# Patient Record
Sex: Male | Born: 1965 | Race: White | Hispanic: No | Marital: Married | Smoking: Former smoker
Health system: Southern US, Community
[De-identification: ages and names within clinical notes are randomized; demographics above are authoritative.]

## PROBLEM LIST (undated history)

## (undated) DIAGNOSIS — E785 Hyperlipidemia, unspecified: Secondary | ICD-10-CM

## (undated) DIAGNOSIS — G4730 Sleep apnea, unspecified: Secondary | ICD-10-CM

## (undated) DIAGNOSIS — R609 Edema, unspecified: Secondary | ICD-10-CM

## (undated) DIAGNOSIS — E079 Disorder of thyroid, unspecified: Secondary | ICD-10-CM

## (undated) DIAGNOSIS — E559 Vitamin D deficiency, unspecified: Secondary | ICD-10-CM

## (undated) DIAGNOSIS — M1990 Unspecified osteoarthritis, unspecified site: Secondary | ICD-10-CM

## (undated) DIAGNOSIS — T7840XA Allergy, unspecified, initial encounter: Secondary | ICD-10-CM

## (undated) DIAGNOSIS — M109 Gout, unspecified: Secondary | ICD-10-CM

## (undated) DIAGNOSIS — I10 Essential (primary) hypertension: Secondary | ICD-10-CM

## (undated) DIAGNOSIS — L659 Nonscarring hair loss, unspecified: Secondary | ICD-10-CM

## (undated) HISTORY — DX: Gout, unspecified: M10.9

## (undated) HISTORY — DX: Hyperlipidemia, unspecified: E78.5

## (undated) HISTORY — PX: SHOULDER ARTHROSCOPY: SHX128

## (undated) HISTORY — PX: KNEE SURGERY: SHX244

## (undated) HISTORY — PX: LEG SURGERY: SHX1003

## (undated) HISTORY — PX: OTHER SURGICAL HISTORY: SHX169

## (undated) HISTORY — DX: Allergy, unspecified, initial encounter: T78.40XA

## (undated) HISTORY — DX: Edema, unspecified: R60.9

## (undated) HISTORY — DX: Vitamin D deficiency, unspecified: E55.9

## (undated) HISTORY — DX: Nonscarring hair loss, unspecified: L65.9

## (undated) HISTORY — DX: Sleep apnea, unspecified: G47.30

---

## 2008-07-05 ENCOUNTER — Emergency Department: Payer: Self-pay | Admitting: Emergency Medicine

## 2009-08-13 ENCOUNTER — Ambulatory Visit: Payer: Self-pay | Admitting: Specialist

## 2009-08-21 ENCOUNTER — Ambulatory Visit: Payer: Self-pay | Admitting: Specialist

## 2009-08-21 HISTORY — PX: SHOULDER ARTHROSCOPY: SHX128

## 2012-05-11 ENCOUNTER — Ambulatory Visit: Payer: Self-pay | Admitting: Family Medicine

## 2013-04-06 ENCOUNTER — Ambulatory Visit: Payer: Self-pay | Admitting: Family Medicine

## 2013-04-28 ENCOUNTER — Ambulatory Visit: Payer: Self-pay | Admitting: Family Medicine

## 2013-08-29 ENCOUNTER — Ambulatory Visit: Payer: Self-pay | Admitting: Internal Medicine

## 2013-08-29 LAB — RAPID STREP-A WITH REFLX: Micro Text Report: NEGATIVE

## 2013-09-01 LAB — BETA STREP CULTURE(ARMC)

## 2015-01-06 ENCOUNTER — Ambulatory Visit: Payer: Self-pay | Admitting: Physician Assistant

## 2015-01-06 LAB — RAPID STREP-A WITH REFLX: Micro Text Report: NEGATIVE

## 2015-01-08 LAB — BETA STREP CULTURE(ARMC)

## 2016-12-22 IMAGING — CR DG CHEST 2V
2 series · 2 of 2 positions shown · non-contrast
Comparison: None.

CLINICAL DATA: Cough and wheezing for 1 week.

EXAM:
CHEST  2 VIEW

[chest pa]
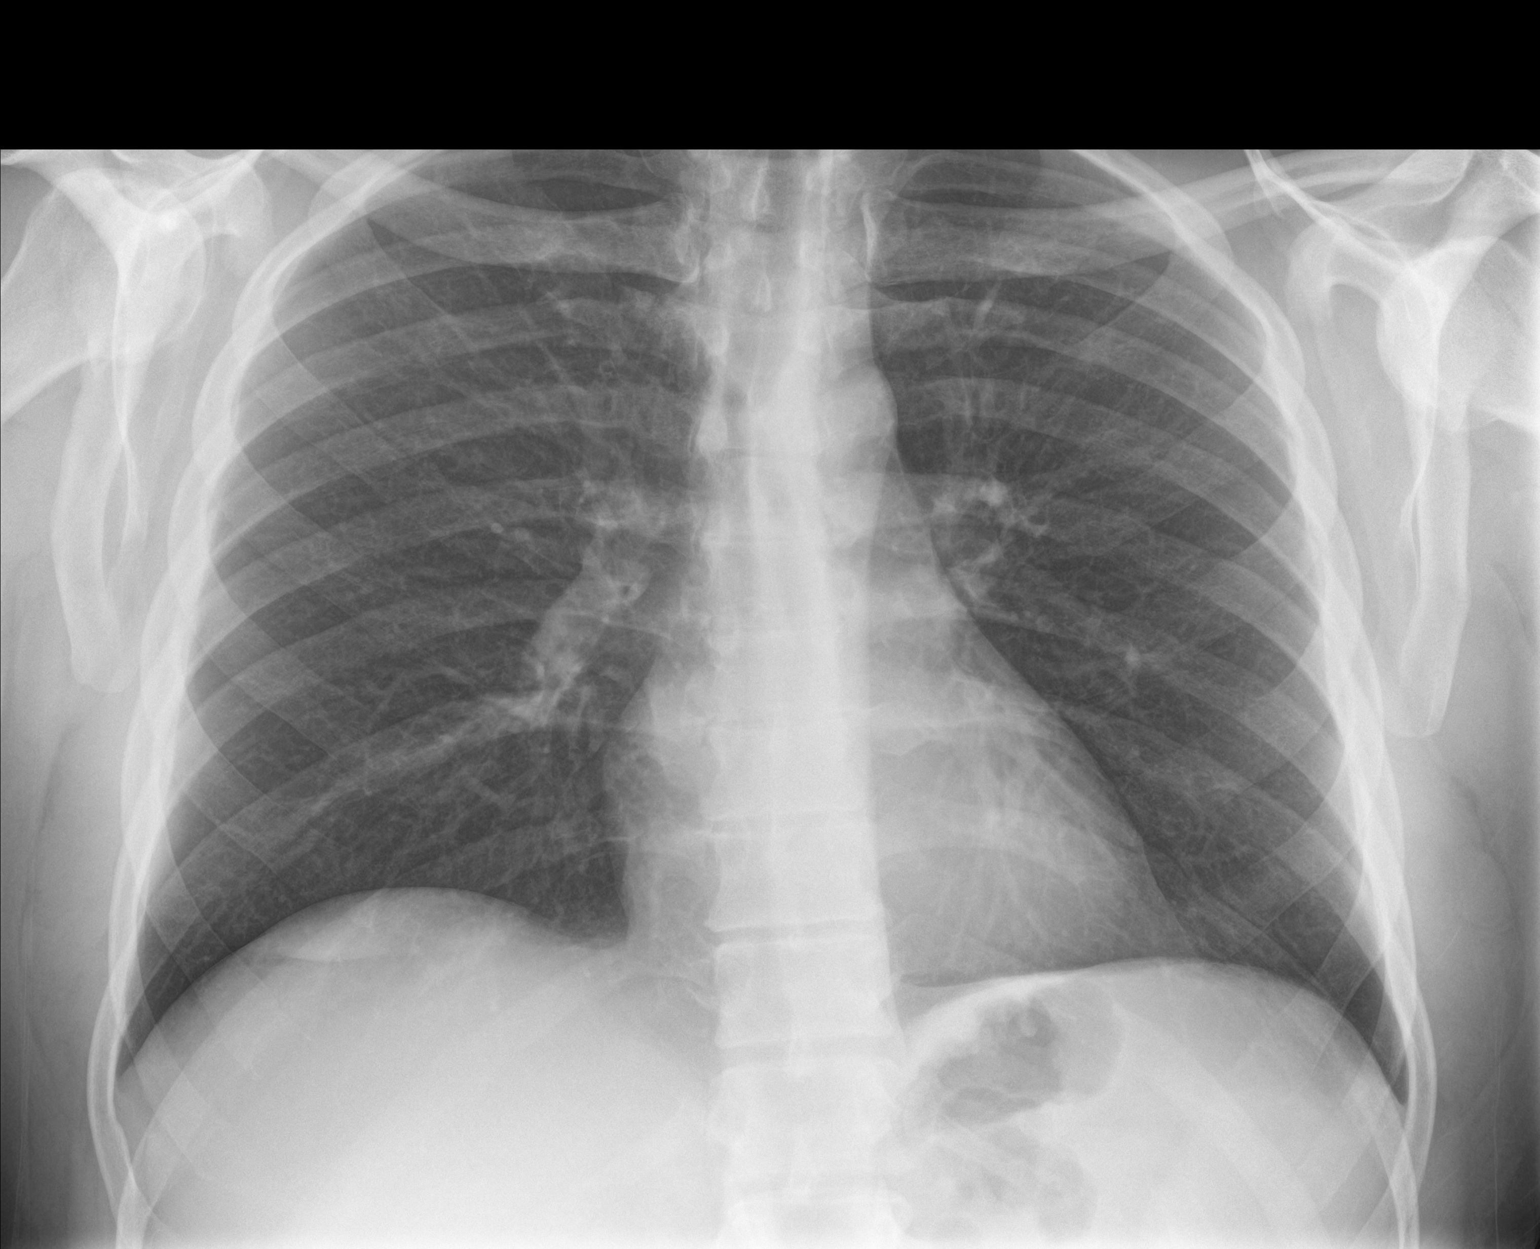

[chest lat]
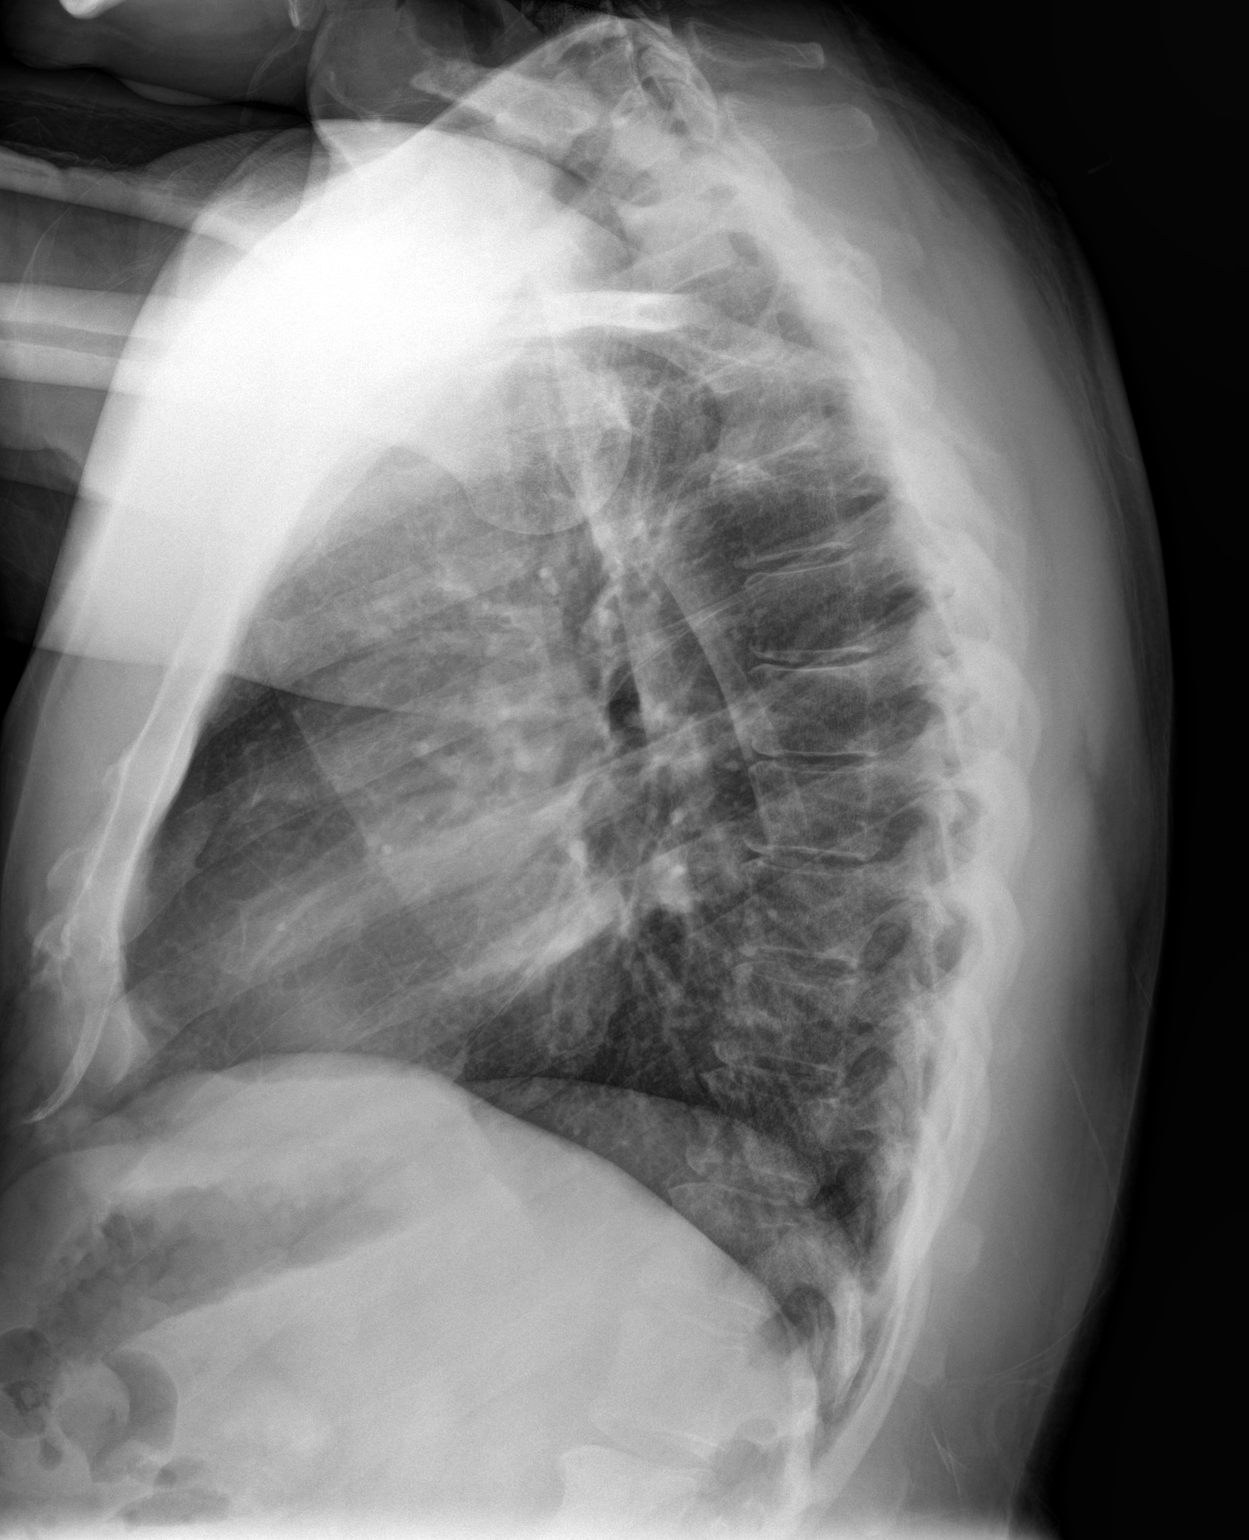

[2 of 2 positions shown; findings below may reference images not displayed]

FINDINGS: Heart size and mediastinal contours are within normal limits. Both
lungs are clear. Visualized skeletal structures are unremarkable.
IMPRESSION: Negative exam.

## 2017-01-13 ENCOUNTER — Ambulatory Visit
Admission: EM | Admit: 2017-01-13 | Discharge: 2017-01-13 | Disposition: A | Discharge status: Home / Self Care | Payer: BLUE CROSS/BLUE SHIELD | Attending: Family Medicine | Admitting: Family Medicine

## 2017-01-13 ENCOUNTER — Encounter: Payer: Self-pay | Admitting: *Deleted

## 2017-01-13 DIAGNOSIS — J111 Influenza due to unidentified influenza virus with other respiratory manifestations: Secondary | ICD-10-CM

## 2017-01-13 DIAGNOSIS — R69 Illness, unspecified: Secondary | ICD-10-CM | POA: Diagnosis not present

## 2017-01-13 HISTORY — DX: Unspecified osteoarthritis, unspecified site: M19.90

## 2017-01-13 HISTORY — DX: Essential (primary) hypertension: I10

## 2017-01-13 HISTORY — DX: Disorder of thyroid, unspecified: E07.9

## 2017-01-13 MED ORDER — OSELTAMIVIR PHOSPHATE 75 MG PO CAPS: 75.0000 mg | ORAL_CAPSULE | Freq: Two times a day (BID) | ORAL | 0 refills | Status: DC

## 2017-01-13 MED ORDER — HYDROCOD POLST-CPM POLST ER 10-8 MG/5ML PO SUER: 5.0000 mL | Freq: Two times a day (BID) | ORAL | 0 refills | Status: DC

## 2017-01-13 NOTE — ED Triage Notes (Signed)
Productive cough- yellow, possible fever, chills, body aches, sore throat, since Monday.

## 2017-01-13 NOTE — ED Provider Notes (Signed)
CSN: [id number]     Arrival date & time 01/13/17  1602 History   First MD Initiated Contact with Patient 01/13/17 1814     Chief Complaint  Patient presents with  . Sore Throat  . Cough  . Chills  . Generalized Body Aches   (Consider location/radiation/quality/duration/timing/severity/associated sxs/prior Treatment) HPI  51 year old male who presents with a history of a productive cough with yellow sputum fever chills body aches and a sore throat. Taking Allegra-D over-the-counter this has not been helping at all. He is an avid Licensed conveyancerweightlifter and states that that he does have body aches from the weight lifting but his more especially in his back and legs.      Past Medical History:  Diagnosis Date  . Arthritis   . Hypertension   . Thyroid disease    Past Surgical History:  Procedure Laterality Date  . SHOULDER ARTHROSCOPY     History reviewed. No pertinent family history. Social History  Substance Use Topics  . Smoking status: Former Games developermoker  . Smokeless tobacco: Never Used  . Alcohol use Yes    Review of Systems  Constitutional: Positive for activity change, chills, fatigue and fever.  HENT: Positive for congestion, postnasal drip and sore throat.   Respiratory: Positive for cough. Negative for shortness of breath, wheezing and stridor.   All other systems reviewed and are negative.   Allergies  Patient has no known allergies.  Home Medications   Prior to Admission medications   Medication Sig Start Date End Date Taking? Authorizing Provider  allopurinol (ZYLOPRIM) 100 MG tablet Take 100 mg by mouth daily.   Yes Historical Provider, MD  carvedilol (COREG) 12.5 MG tablet Take 12.5 mg by mouth 2 (two) times daily with a meal.   Yes Historical Provider, MD  GEMFIBROZIL PO Take by mouth.   Yes Historical Provider, MD  levothyroxine (SYNTHROID, LEVOTHROID) 25 MCG tablet Take 25 mcg by mouth daily before breakfast.   Yes Historical Provider, MD  lisinopril  (PRINIVIL,ZESTRIL) 10 MG tablet Take 10 mg by mouth daily.   Yes Historical Provider, MD  Omega-3 Fatty Acids (FISH-EPA PO) Take by mouth.   Yes Historical Provider, MD  chlorpheniramine-HYDROcodone (TUSSIONEX PENNKINETIC ER) 10-8 MG/5ML SUER Take 5 mLs by mouth 2 (two) times daily. 01/13/17   Lutricia FeilWilliam P Donnalynn Wheeless, PA-C  oseltamivir (TAMIFLU) 75 MG capsule Take 1 capsule (75 mg total) by mouth every 12 (twelve) hours. 01/13/17   Lutricia FeilWilliam P Normajean Nash, PA-C   Meds Ordered and Administered this Visit  Medications - No data to display  BP (!) 146/92 (BP Location: Left Arm)   Pulse 63   Temp 98.4 F (36.9 C) (Oral)   Resp 16   Ht 6' (1.829 m)   Wt 220 lb (99.8 kg)   SpO2 99%   BMI 29.84 kg/m  No data found.   Physical Exam  Constitutional: He is oriented to person, place, and time. He appears well-developed and well-nourished. No distress.  HENT:  Head: Normocephalic and atraumatic.  Right Ear: External ear normal.  Left Ear: External ear normal.  Nose: Nose normal.  Mouth/Throat: Oropharynx is clear and moist. No oropharyngeal exudate.  His pharynx is benign however he does have a significant postnasal drip present  Eyes: EOM are normal. Pupils are equal, round, and reactive to light. Right eye exhibits no discharge. Left eye exhibits no discharge.  Neck: Normal range of motion. Neck supple.  Pulmonary/Chest: Effort normal and breath sounds normal. No respiratory distress. He has no  wheezes. He has no rales.  Musculoskeletal: Normal range of motion.  Lymphadenopathy:    He has no cervical adenopathy.  Neurological: He is alert and oriented to person, place, and time.  Skin: Skin is warm and dry. He is not diaphoretic.  Psychiatric: He has a normal mood and affect. His behavior is normal. Judgment and thought content normal.  Nursing note and vitals reviewed.   Urgent Care Course     Procedures (including critical care time)  Labs Review Labs Reviewed - No data to display  Imaging  Review No results found.   Visual Acuity Review  Right Eye Distance:   Left Eye Distance:   Bilateral Distance:    Right Eye Near:   Left Eye Near:    Bilateral Near:         MDM   1. Influenza-like illness    Discharge Medication List as of 01/13/2017  6:31 PM    START taking these medications   Details  chlorpheniramine-HYDROcodone (TUSSIONEX PENNKINETIC ER) 10-8 MG/5ML SUER Take 5 mLs by mouth 2 (two) times daily., Starting Thu 01/13/2017, Print    oseltamivir (TAMIFLU) 75 MG capsule Take 1 capsule (75 mg total) by mouth every 12 (twelve) hours., Starting Thu 01/13/2017, Normal      Plan: 1. Test/x-ray results and diagnosis reviewed with patient 2. rx as per orders; risks, benefits, potential side effects reviewed with patient 3. Recommend supportive treatment with Fluids and rest. Tylenol or Motrin for body aches and fever. Start Flonase. Use of Tussionex with activities requiring concentration and judgment and also not to drive for taking the medication. If is not improving he should follow-up with his primary care physician 4. F/u prn if symptoms worsen or don't improve     Lutricia Feil, PA-C 01/13/17 1837

## 2017-10-11 ENCOUNTER — Other Ambulatory Visit: Payer: Self-pay

## 2017-10-11 ENCOUNTER — Ambulatory Visit
Admission: EM | Admit: 2017-10-11 | Discharge: 2017-10-11 | Disposition: A | Discharge status: Home / Self Care | Payer: BLUE CROSS/BLUE SHIELD | Attending: Family Medicine | Admitting: Family Medicine

## 2017-10-11 ENCOUNTER — Encounter: Payer: Self-pay | Admitting: Emergency Medicine

## 2017-10-11 DIAGNOSIS — B349 Viral infection, unspecified: Secondary | ICD-10-CM

## 2017-10-11 DIAGNOSIS — J029 Acute pharyngitis, unspecified: Secondary | ICD-10-CM

## 2017-10-11 NOTE — ED Triage Notes (Signed)
Patient c/o sore throat, bodyaches, cough, and HAs that started last night.

## 2017-10-11 NOTE — ED Provider Notes (Signed)
[facility]    CSN: [id number] Arrival date & time: 10/11/17  1536     History   Chief Complaint Chief Complaint  Patient presents with  . Sore Throat  . Cough  . Generalized Body Aches  . Headache   HPI  51 year old male presents with the above complaints.  Patient states that he has been sick since last night. He developed sore throat, body aches, cough, headache, and chills.  No fever.  No medications or interventions tried.  No known exacerbating or relieving factors.  No other associated symptoms.  No other complaints at this time.  Past Medical History:  Diagnosis Date  . Arthritis   . Hypertension   . Thyroid disease    Past Surgical History:  Procedure Laterality Date  . SHOULDER ARTHROSCOPY     Home Medications    Prior to Admission medications   Medication Sig Start Date End Date Taking? Authorizing Provider  allopurinol (ZYLOPRIM) 100 MG tablet Take 100 mg by mouth daily.   Yes [provider]  GEMFIBROZIL PO Take by mouth.   Yes [provider]  levothyroxine (SYNTHROID, LEVOTHROID) 25 MCG tablet Take 25 mcg by mouth daily before breakfast.   Yes [provider]  lisinopril (PRINIVIL,ZESTRIL) 10 MG tablet Take 10 mg by mouth daily.   Yes [provider]  Omega-3 Fatty Acids (FISH-EPA PO) Take by mouth.   Yes [provider]  carvedilol (COREG) 12.5 MG tablet Take 12.5 mg by mouth 2 (two) times daily with a meal.    [provider]    Family History History reviewed. No pertinent family history.  Social History Social History   Tobacco Use  . Smoking status: Former Games developermoker  . Smokeless tobacco: Never Used  Substance Use Topics  . Alcohol use: Yes  . Drug use: No     Allergies   Patient has no known allergies.   Review of Systems Review of Systems   Physical Exam Triage Vital Signs ED Triage Vitals  Enc Vitals Group     BP 10/11/17 1552 (!) 162/92     Pulse Rate  10/11/17 1552 82     Resp 10/11/17 1552 16     Temp 10/11/17 1552 98.6 F (37 C)     Temp Source 10/11/17 1552 Oral     SpO2 10/11/17 1552 98 %     Weight 10/11/17 1548 227 lb 1.2 oz (103 kg)     Height 10/11/17 1548 6' (1.829 m)     Head Circumference --      Peak Flow --      Pain Score 10/11/17 1548 5     Pain Loc --      Pain Edu? --      Excl. in GC? --    Updated Vital Signs BP (!) 162/92 (BP Location: Left Arm)   Pulse 82   Temp 98.6 F (37 C) (Oral)   Resp 16   Ht 6' (1.829 m)   Wt 227 lb 1.2 oz (103 kg)   SpO2 98%   BMI 30.80 kg/m   Physical Exam  Constitutional: He is oriented to person, place, and time. He appears well-developed and well-nourished. No distress.  HENT:  Head: Normocephalic and atraumatic.  Mouth/Throat: Oropharynx is clear and moist.  Eyes: Conjunctivae are normal.  Neck: Neck supple.  Cardiovascular: Normal rate and regular rhythm.  No murmur heard. Pulmonary/Chest: Effort normal and breath sounds normal. He has no wheezes. He has no  rales.  Lymphadenopathy:    He has no cervical adenopathy.  Neurological: He is alert and oriented to person, place, and time. He exhibits normal muscle tone.  Psychiatric: He has a normal mood and affect. His behavior is normal.  Vitals reviewed.  UC Treatments / Results  Labs (all labs ordered are listed, but only abnormal results are displayed) Labs Reviewed - No data to display  EKG  EKG Interpretation None       Radiology No results found.  Procedures Procedures (including critical care time)  Medications Ordered in UC Medications - No data to display   Initial Impression / Assessment and Plan / UC Course  I have reviewed the triage vital signs and the nursing notes.  Pertinent labs & imaging results that were available during my care of the patient were reviewed by me and considered in my medical decision making (see chart for details).     51 year old male presents with a viral  illness.  Supportive care.  Ibuprofen as needed.     Final Clinical Impressions(s) / UC Diagnoses   Final diagnoses:  Pharyngitis, unspecified etiology    ED Discharge Orders    None     Controlled Substance Prescriptions  Controlled Substance Registry consulted? Not Applicable   Tommie SamsCook, Shunda Rabadi G, DO 10/11/17 1623

## 2017-12-07 ENCOUNTER — Ambulatory Visit: Payer: Self-pay

## 2017-12-21 ENCOUNTER — Ambulatory Visit: Payer: BLUE CROSS/BLUE SHIELD

## 2018-05-11 ENCOUNTER — Ambulatory Visit: Payer: Self-pay | Admitting: Internal Medicine

## 2018-06-12 DIAGNOSIS — S46919A Strain of unspecified muscle, fascia and tendon at shoulder and upper arm level, unspecified arm, initial encounter: Secondary | ICD-10-CM | POA: Insufficient documentation

## 2018-06-12 DIAGNOSIS — S46219A Strain of muscle, fascia and tendon of other parts of biceps, unspecified arm, initial encounter: Secondary | ICD-10-CM | POA: Insufficient documentation

## 2018-06-12 DIAGNOSIS — S46119A Strain of muscle, fascia and tendon of long head of biceps, unspecified arm, initial encounter: Secondary | ICD-10-CM | POA: Insufficient documentation

## 2018-06-21 ENCOUNTER — Ambulatory Visit: Payer: BLUE CROSS/BLUE SHIELD

## 2018-06-21 DIAGNOSIS — G4733 Obstructive sleep apnea (adult) (pediatric): Secondary | ICD-10-CM | POA: Diagnosis not present

## 2018-06-21 NOTE — Progress Notes (Signed)
95 percentile pressure 9    95th percentile leak 0.10    apnea-hypopnea index  1.1 /hr   total days used  >4 hr 29 days  total days used <4 hr 1 days  Total compliance 96.7 percent  Pt doing great no problems or questions.

## 2018-06-22 ENCOUNTER — Telehealth: Payer: Self-pay

## 2018-06-22 NOTE — Telephone Encounter (Signed)
error 

## 2018-07-10 ENCOUNTER — Ambulatory Visit: Payer: BLUE CROSS/BLUE SHIELD | Admitting: Adult Health

## 2018-07-10 ENCOUNTER — Encounter: Payer: Self-pay | Admitting: Adult Health

## 2018-07-10 VITALS — BP 130/80 | HR 63 | Resp 16 | Ht 72.0 in | Wt 234.0 lb

## 2018-07-10 DIAGNOSIS — G4733 Obstructive sleep apnea (adult) (pediatric): Secondary | ICD-10-CM | POA: Diagnosis not present

## 2018-07-10 DIAGNOSIS — I10 Essential (primary) hypertension: Secondary | ICD-10-CM | POA: Diagnosis not present

## 2018-07-10 DIAGNOSIS — E039 Hypothyroidism, unspecified: Secondary | ICD-10-CM

## 2018-07-10 NOTE — Progress Notes (Signed)
[facility] [address] [place], [place] [address]  Internal MEDICINE  Office Visit Note  Patient Name: Eugene [name]  213086Nov 04, 2067  [id number]  Date of Service: 07/10/2018  Chief Complaint  Patient presents with  . Sleep Apnea    HPI Pt here for CPAP follow up.  Denies issues.  Reports sleeping well. His CPAP compliance is greater than 96%. He reports he uses his old machine when he travels, and that may contribute to his compliance appearing to be less than 100%.  He reports he is cleaning his machine with soap and water. He reports his machine is about 53 years old.      Current Medication: Outpatient Encounter Medications as of 07/10/2018  Medication Sig  . allopurinol (ZYLOPRIM) 100 MG tablet Take 100 mg by mouth daily.  . carvedilol (COREG) 12.5 MG tablet Take 12.5 mg by mouth 2 (two) times daily with a meal.  . GEMFIBROZIL PO Take by mouth.  . levothyroxine (SYNTHROID, LEVOTHROID) 25 MCG tablet Take 25 mcg by mouth daily before breakfast.  . lisinopril (PRINIVIL,ZESTRIL) 10 MG tablet Take 10 mg by mouth daily.  . Omega-3 Fatty Acids (FISH-EPA PO) Take by mouth.   No facility-administered encounter medications on file as of 07/10/2018.     Surgical History: Past Surgical History:  Procedure Laterality Date  . knee sugery Right   . left elbow surgery    . SHOULDER ARTHROSCOPY      Medical History: Past Medical History:  Diagnosis Date  . Arthritis   . Hypertension   . Sleep apnea   . Thyroid disease     Family History: Family History  Problem Relation Age of Onset  . Hypertension Father     Social History   Socioeconomic History  . Marital status: Married    Spouse name: Not on file  . Number of children: Not on file  . Years of education: Not on file  . Highest education level: Not on file  Occupational History  . Not on file  Social Needs  . Financial resource strain: Not on file  . Food insecurity:    Worry: Not on file   Inability: Not on file  . Transportation needs:    Medical: Not on file    Non-medical: Not on file  Tobacco Use  . Smoking status: Former Games developermoker  . Smokeless tobacco: Never Used  Substance and Sexual Activity  . Alcohol use: Yes  . Drug use: No  . Sexual activity: Not on file  Lifestyle  . Physical activity:    Days per week: Not on file    Minutes per session: Not on file  . Stress: Not on file  Relationships  . Social connections:    Talks on phone: Not on file    Gets together: Not on file    Attends religious service: Not on file    Active member of club or organization: Not on file    Attends meetings of clubs or organizations: Not on file    Relationship status: Not on file  . Intimate partner violence:    Fear of current or ex partner: Not on file    Emotionally abused: Not on file    Physically abused: Not on file    Forced sexual activity: Not on file  Other Topics Concern  . Not on file  Social History Narrative  . Not on file      Review of Systems  Constitutional: Negative.  Negative for  chills, fatigue and unexpected weight change.  HENT: Negative.  Negative for congestion, rhinorrhea, sneezing and sore throat.   Eyes: Negative for redness.  Respiratory: Negative.  Negative for cough, chest tightness and shortness of breath.   Cardiovascular: Negative.  Negative for chest pain and palpitations.  Gastrointestinal: Negative.  Negative for abdominal pain, constipation, diarrhea, nausea and vomiting.  Endocrine: Negative.   Genitourinary: Negative.  Negative for dysuria and frequency.  Musculoskeletal: Negative.  Negative for arthralgias, back pain, joint swelling and neck pain.  Skin: Negative.  Negative for rash.  Allergic/Immunologic: Negative.   Neurological: Negative.  Negative for tremors and numbness.  Hematological: Negative for adenopathy. Does not bruise/bleed easily.  Psychiatric/Behavioral: Negative.  Negative for behavioral problems, sleep  disturbance and suicidal ideas. The patient is not nervous/anxious.     Vital Signs: BP 130/80   Pulse 63   Resp 16   Ht 6' (1.829 m)   Wt 234 lb (106.1 kg)   SpO2 98%   BMI 31.74 kg/m    Physical Exam  Constitutional: He is oriented to person, place, and time. He appears well-developed and well-nourished. No distress.  HENT:  Head: Normocephalic and atraumatic.  Mouth/Throat: Oropharynx is clear and moist. No oropharyngeal exudate.  Eyes: Pupils are equal, round, and reactive to light. EOM are normal.  Neck: Normal range of motion. Neck supple. No JVD present. No tracheal deviation present. No thyromegaly present.  Cardiovascular: Normal rate, regular rhythm and normal heart sounds. Exam reveals no gallop and no friction rub.  No murmur heard. Pulmonary/Chest: Effort normal and breath sounds normal. No respiratory distress. He has no wheezes. He has no rales. He exhibits no tenderness.  Abdominal: Soft. There is no tenderness. There is no guarding.  Musculoskeletal: Normal range of motion.  Lymphadenopathy:    He has no cervical adenopathy.  Neurological: He is alert and oriented to person, place, and time. No cranial nerve deficit.  Skin: Skin is warm and dry. He is not diaphoretic.  Psychiatric: He has a normal mood and affect. His behavior is normal. Judgment and thought content normal.  Nursing note and vitals reviewed.   Assessment/Plan: 1. Obstructive sleep apnea (adult) (pediatric) Stable, Continue current therapy.   2. Hypertension, unspecified type Stable, continue current regimen.   3. Hypothyroidism, unspecified type Continue medications as ordered.    General Counseling: Eugene [name] understanding of the findings of todays visit and agrees with plan of treatment. I have discussed any further diagnostic evaluation that may be needed or ordered today. We also reviewed his medications today. he has been encouraged to call the office with any questions or  concerns that should arise related to todays visit.    No orders of the defined types were placed in this encounter.   No orders of the defined types were placed in this encounter.   Time spent: 25 Minutes   This patient was seen by Blima Ledger AGNP-C in Collaboration with Dr. Freda Munro as a part of collaborative care agreement.

## 2018-07-10 NOTE — Patient Instructions (Signed)

## 2018-07-13 ENCOUNTER — Encounter: Payer: Self-pay | Admitting: Adult Health

## 2019-02-28 ENCOUNTER — Ambulatory Visit: Payer: Self-pay

## 2019-07-17 ENCOUNTER — Ambulatory Visit: Payer: Self-pay | Admitting: Internal Medicine

## 2021-02-25 ENCOUNTER — Other Ambulatory Visit: Payer: Self-pay

## 2021-02-25 ENCOUNTER — Ambulatory Visit: Payer: BC Managed Care – PPO

## 2021-02-25 DIAGNOSIS — G4733 Obstructive sleep apnea (adult) (pediatric): Secondary | ICD-10-CM | POA: Diagnosis not present

## 2021-02-26 NOTE — Progress Notes (Signed)
No download.PT machine is broke and its under recall.Pt need new machine need appt with dr

## 2021-04-23 ENCOUNTER — Encounter: Payer: Self-pay | Admitting: Internal Medicine

## 2021-04-23 ENCOUNTER — Other Ambulatory Visit: Payer: Self-pay

## 2021-04-23 ENCOUNTER — Ambulatory Visit: Payer: BC Managed Care – PPO | Admitting: Internal Medicine

## 2021-04-23 VITALS — BP 140/90 | HR 70 | Temp 97.9°F | Resp 16 | Ht 72.0 in | Wt 215.8 lb

## 2021-04-23 DIAGNOSIS — Z7189 Other specified counseling: Secondary | ICD-10-CM

## 2021-04-23 DIAGNOSIS — R0602 Shortness of breath: Secondary | ICD-10-CM

## 2021-04-23 DIAGNOSIS — G4733 Obstructive sleep apnea (adult) (pediatric): Secondary | ICD-10-CM

## 2021-04-23 DIAGNOSIS — R062 Wheezing: Secondary | ICD-10-CM

## 2021-04-23 MED ORDER — IPRATROPIUM-ALBUTEROL 0.5-2.5 (3) MG/3ML IN SOLN
3.0000 mL | Freq: Once | RESPIRATORY_TRACT | Status: AC
  Administered 2021-04-23: 3 mL via RESPIRATORY_TRACT

## 2021-04-23 NOTE — Progress Notes (Signed)
[facility] [address] [place], [place] [address]  Pulmonary Sleep Medicine   Office Visit Note  Patient Name: Eugene [name] DOB: Oct 29, 1966 MRN [id number]  Date of Service: 04/23/2021  Complaints/HPI: OSA Machine is broken. His last sleep study. Has had some weight loss since the last time that he was here in the clinic.  The patient states that he has been working on little bit more conservative exercise and also trying to work on toning down and trimming down.  The machine has not been functioning.  He still feels as though he might need to use the machine.  We do not have a recent sleep study on file for him and he will need evaluation.  ROS  General: (-) fever, (-) chills, (-) night sweats, (-) weakness Skin: (-) rashes, (-) itching,. Eyes: (-) visual changes, (-) redness, (-) itching. Nose and Sinuses: (-) nasal stuffiness or itchiness, (-) postnasal drip, (-) nosebleeds, (-) sinus trouble. Mouth and Throat: (-) sore throat, (-) hoarseness. Neck: (-) swollen glands, (-) enlarged thyroid, (-) neck pain. Respiratory: - cough, (-) bloody sputum, - shortness of breath, - wheezing. Cardiovascular: - ankle swelling, (-) chest pain. Lymphatic: (-) lymph node enlargement. Neurologic: (-) numbness, (-) tingling. Psychiatric: (-) anxiety, (-) depression   Current Medication: Outpatient Encounter Medications as of 04/23/2021  Medication Sig   allopurinol (ZYLOPRIM) 100 MG tablet Take 100 mg by mouth daily.   carvedilol (COREG) 12.5 MG tablet Take 12.5 mg by mouth 2 (two) times daily with a meal.   GEMFIBROZIL PO Take by mouth.   levothyroxine (SYNTHROID, LEVOTHROID) 25 MCG tablet Take 25 mcg by mouth daily before breakfast.   lisinopril (PRINIVIL,ZESTRIL) 10 MG tablet Take 10 mg by mouth daily.   Omega-3 Fatty Acids (FISH-EPA PO) Take by mouth.   No facility-administered encounter medications on file as of 04/23/2021.    Surgical History: Past Surgical History:   Procedure Laterality Date   knee sugery Right    left elbow surgery     SHOULDER ARTHROSCOPY      Medical History: Past Medical History:  Diagnosis Date   Arthritis    Hypertension    Sleep apnea    Thyroid disease     Family History: Family History  Problem Relation Age of Onset   Hypertension Father     Social History: Social History   Socioeconomic History   Marital status: Married    Spouse name: Not on file   Number of children: Not on file   Years of education: Not on file   Highest education level: Not on file  Occupational History   Not on file  Tobacco Use   Smoking status: Former Smoker   Smokeless tobacco: Never Used  Building services engineer Use: Never used  Substance and Sexual Activity   Alcohol use: Yes    Comment: beer 1-2 a week   Drug use: No   Sexual activity: Not on file  Other Topics Concern   Not on file  Social History Narrative   Not on file   Social Determinants of Health   Financial Resource Strain: Not on file  Food Insecurity: Not on file  Transportation Needs: Not on file  Physical Activity: Not on file  Stress: Not on file  Social Connections: Not on file  Intimate Partner Violence: Not on file    Vital Signs: Blood pressure 140/90, pulse 70, temperature 97.9 F (36.6 C), resp. rate 16, height 6' (1.829 m), weight  215 lb 12.8 oz (97.9 kg), SpO2 100 %.  Examination: General Appearance: The patient is well-developed, well-nourished, and in no distress. Skin: Gross inspection of skin unremarkable. Head: normocephalic, no gross deformities. Eyes: no gross deformities noted. ENT: ears appear grossly normal no exudates. Neck: Supple. No thyromegaly. No LAD. Respiratory: no rhonchi noted. Cardiovascular: Normal S1 and S2 without murmur or rub. Extremities: No cyanosis. pulses are equal. Neurologic: Alert and oriented. No involuntary movements.  LABS: No results found for this or any previous visit (from the past 2160  hour(s)).  Radiology: No results found.  No results found.  No results found.    Assessment and Plan: Patient Active Problem List   Diagnosis Date Noted   Obstructive sleep apnea syndrome in adult 07/10/2018     1. OSA (obstructive sleep apnea) I am going to go ahead and schedule him for a follow-up PSG we have not had a PSG done on him and his machine currently is nonfunctional.  In order for [place] to get a proper idea of how the weight loss has affected his sleep disordered breathing we will need to have this reassessed. - PSG Sleep Study; Future  2. Obesity, morbid (HCC) Continues to do well with loss of weight.  He is watching his diet and he continues to exercise and has had fairly good success with his weight loss which he will continue to pursue  3. CPAP use counseling CPAP Counseling: had a lengthy discussion with the patient regarding the importance of PAP therapy in management of the sleep apnea. Patient appears to understand the risk factor reduction and also understands the risks associated with untreated sleep apnea. Patient will try to make a good faith effort to remain compliant with therapy. Also instructed the patient on proper cleaning of the device including the water must be changed daily if possible and use of distilled water is preferred. Patient understands that the machine should be regularly cleaned with appropriate recommended cleaning solutions that do not damage the PAP machine for example given white vinegar and water rinses. Other methods such as ozone treatment may not be as good as these simple methods to achieve cleaning.   General Counseling: I have discussed the findings of the evaluation and examination with Fayrene Fearing.  I have also discussed any further diagnostic evaluation thatmay be needed or ordered today. Sedrick verbalizes understanding of the findings of todays visit. We also reviewed his medications today and discussed drug interactions and side effects  including but not limited excessive drowsiness and altered mental states. We also discussed that there is always a risk not just to him but also people around him. he has been encouraged to call the office with any questions or concerns that should arise related to todays visit.  No orders of the defined types were placed in this encounter.    Time spent: 96  I have personally obtained a history, examined the patient, evaluated laboratory and imaging results, formulated the assessment and plan and placed orders.    Yevonne Pax, MD [facility] Pulmonary and Critical Care Sleep medicine

## 2021-04-23 NOTE — Patient Instructions (Signed)

## 2021-04-23 NOTE — Progress Notes (Signed)
Sample of trelegy 100 Mcg given to pt and I gave pt instructions on how to use.

## 2021-05-29 ENCOUNTER — Telehealth: Payer: Self-pay

## 2021-05-29 NOTE — Telephone Encounter (Signed)
Patient is scheduled SS for Tues, June 02, 2021.

## 2021-06-02 ENCOUNTER — Encounter (INDEPENDENT_AMBULATORY_CARE_PROVIDER_SITE_OTHER): Payer: BC Managed Care – PPO | Admitting: Internal Medicine

## 2021-06-02 DIAGNOSIS — G4733 Obstructive sleep apnea (adult) (pediatric): Secondary | ICD-10-CM

## 2021-06-04 NOTE — Procedures (Signed)
[facility]  Polysomnogram Report Part I                                                                 Phone: [phone] Fax: [phone]  Patient Name: Eugene, [name] Acquisition Number: [id number]  Date of Birth: 14-Jun-1966 Acquisition Date: 06/02/2021  Referring Physician: Sallyanne Kuster, FNP-C     History: The patient is a 55 year old male who was referred for re-evaluation of obstructive sleep apnea. Medical History: arthritis, hypertension, sleep apnea, thyroid disease.  Medications: allopurinol, carvedilol, gemfibrozil, levothyroxine, lisinopril, omega-3 fatty acids.  Procedure: This routine overnight polysomnogram was performed on the Alice 5 using the standard diagnostic protocol. This included 6 channels of EEG, 2 channels of EOG, chin EMG, bilateral anterior tibialis EMG, nasal/oral thermistor, PTAF (nasal pressure transducer), chest and abdominal wall movements, EKG, and pulse oximetry.  Description: The total recording time was 434.7 minutes. The total sleep time was 348.8 minutes. There were a total of 82.5 minutes of wakefulness after sleep onset for a reducedsleep efficiency of 80.2%. The latency to sleep onset was shortat 3.4 minutes. The R sleep onset latency was within normal limits at 91.0 minutes. Sleep parameters, as a percentage of the total sleep time, demonstrated 8.9% of sleep was in N1 sleep, 78.2% N2, 0.0% N3 and 12.9% R sleep. There were a total of 165 arousals for an arousal index of 28.4 arousals per hour of sleep that was elevated.  Respiratory monitoring demonstrated nearly continuous moderate degree of snoring in all positions. There were 193 apneas and hypopneas for an Apnea Hypopnea Index of 33.2 apneas and hypopneas per hour of sleep. The REM related apnea hypopnea index was 61.3/hr of REM sleep compared to a NREM AHI of 28.6/hr.  The average duration of the respiratory events was 25.0 seconds with a maximum duration of 75.5 seconds. The  respiratory events were associated with peripheral oxygen desaturations on the average to 88%.  The respiratory events occurred exclusively in the supine position with an AHI of 48.8.The lowest oxygen desaturation associated with a respiratory event was 77%. Additionally, the baseline oxygen saturation during wakefulness was 93%, during NREM sleep averaged 92%, and during REM sleep averaged 92%. The total duration of oxygen < 90% was 50.2 minutes and <80% was 0.9 minutes.  Cardiac monitoring- did not demonstrate transient cardiac decelerations associated with the apneas. There were no significant cardiac rhythm irregularities.   Periodic limb movement monitoring- did not demonstrate periodic limb movements.  Quasi-periodic limb movements were observed during periods of wakefulness.  Impression: This routine overnight polysomnogram confirms the presence of severe, position-dependent obstructive sleep apnea with an overall Apnea Hypopnea Index of 33.2 apneas and hypopneas per hour of sleep. The respiratory events occurred exclusively in the supine position with an AHI of 48.8 and increased to 61.3 in REM sleep.    Quasi-periodic limb movements were observed during periods of wakefulness. Sometimes these limb movements subside once the apnea is controlled. Clinical correlation is suggested.  There was a reduced sleep efficiency with anelevated arousal index,increased awakenings, a reduced REM percentage and no slow wave sleep.  These findings would appear to be due to the obstructive sleep apnea.  Recommendations:    A CPAP titration would be recommended due  to the severity of the sleep apnea. Some supine sleep should be ensured to optimize the titration. Additionally, would recommend weight loss in a patient with a BMI of 29.2.     Yevonne Pax, MD, [facility] Diplomate ABMS-Pulmonary, Critical Care and Sleep Medicine  Electronically reviewed and digitally signed     [facility] Polysomnogram Report Part II  Phone: [phone] Fax: [phone]  Patient last name [name] Neck Size 16.5 in. Acquisition [id number]  Patient first name Eugene Weight 215.0 lbs. Started 06/02/2021 at 9:41:40 PM  Birth date May 06, 1966 Height 72.0 in. Stopped 06/03/2021 at 5:00:52 AM  Age 19 BMI 29.2 lb/in2 Duration 434.7  Study Type Adult      Report generated by: Hampton Abbot, RPSGT Sleep Data: Lights Out: 9:45:16 PM Sleep Onset: 9:48:40 PM  Lights On: 4:59:58 AM Sleep Efficiency: 80.2 %  Total Recording Time: 434.7 min Sleep Latency (from Lights Off) 3.4 min  Total Sleep Time (TST): 348.8 min R Latency (from Sleep Onset): 91.0 min  Sleep Period Time: 431.3 min Total number of awakenings: 36  Wake during sleep: 82.5 min Wake After Sleep Onset (WASO): 82.5 min   Sleep Data:         Arousal Summary: Stage  Latency from lights out (min) Latency from sleep onset (min) Duration (min) % Total Sleep Time  Normal values  N 1 3.4 0.0 31.0 8.9 (5%)  N 2 4.4 1.0 272.8 78.2 (50%)  N 3       0.0 0.0 (20%)  R 94.4 91.0 45.0 12.9 (25%)    Number Index  Spontaneous 79 13.6  Apneas & Hypopneas 113 19.4  RERAs 0 0.0       (Apneas & Hypopneas & RERAs)  (113) (19.4)  Limb Movement 0 0.0  Snore 0 0.0  TOTAL 192 33.0     Respiratory Data:  CA OA MA Apnea Hypopnea* A+ H RERA Total  Number 0 4 0 4 189 193 0 193  Mean Dur (sec) 0.0 16.3 0.0 16.3 25.2 25.0 0.0 25.0  Max Dur (sec) 0.0 20.5 0.0 20.5 75.5 75.5 0.0 75.5  Total Dur (min) 0.0 1.1 0.0 1.1 79.4 80.5 0.0 80.5  % of TST 0.0 0.3 0.0 0.3 22.8 23.1 0.0 23.1  Index (#/h TST) 0.0 0.7 0.0 0.7 32.5 33.2 0.0 33.2  *Hypopneas scored based on 4% or greater desaturation.  Sleep Stage:        REM NREM TST  AHI 61.3 28.6 33.2  RDI 61.3 28.6 33.2           Body Position Data:  Sleep (min) TST (%) REM (min) NREM (min) CA (#) OA (#) MA (#) HYP (#) AHI (#/h) RERA (#) RDI (#/h) Desat (#)  Supine 237.5 68.09 31.5 206.0  0 4 0 189 48.8 0 48.8 210  Non-Supine 111.30 31.91 13.50 97.80 0.00 0.00 0.00 0.00 0.00 0 0.00 1.00  Left: 100.5 28.81 13.5 87.0 0 0 0 0 0.0 0 0.00 1  Right: 10.8 3.10 0.0 10.8 0 0 0 0 0.0 0 0.00 0  UP: 0.0 0.00 0.0 0.0 0 0 0 0 0.0 0 0.00 0     Snoring: Total number of snoring episodes  0  Total time with snoring    min (   % of sleep)   Oximetry Distribution:             WK REM NREM TOTAL  Average (%)   93 92 92 92  <  90% 2.0 6.4 41.8 50.2  < 80% 0.5 0.0 0.4 0.9  < 70% 0.4 0.0 0.0 0.4  # of Desaturations* 6 45 160 211  Desat Index (#/hour) 4.4 60.0 31.6 36.3  Desat Max (%) 6 18 20 20   Desat Max Dur (sec) 81.0 71.0 91.0 91.0  Approx Min O2 during sleep 77  Approx min O2 during a respiratory event 77  Was Oxygen added (Y/N) and final rate No:   0 LPM  *Desaturations based on 4% or greater drop from baseline.   Cheyne Stokes Breathing: None Present   Heart Rate Summary:  Average Heart Rate During Sleep 62.7 bpm      Highest Heart Rate During Sleep (95th %) 71.0 bpm      Highest Heart Rate During Sleep 161 bpm (artifact)  Highest Heart Rate During Recording (TIB) 212 bpm (artifact)   Heart Rate Observations: Event Type # Events   Bradycardia 0 Lowest HR Scored: N/A  Sinus Tachycardia During Sleep 0 Highest HR Scored: N/A  Narrow Complex Tachycardia 0 Highest HR Scored: N/A  Wide Complex Tachycardia 0 Highest HR Scored: N/A  Asystole 0 Longest Pause: N/A  Atrial Fibrillation 0 Duration Longest Event: N/A  Other Arrythmias  No Type:    Periodic Limb Movement Data: (Primary legs unless otherwise noted) Total # Limb Movement 0 Limb Movement Index 0.0  Total # PLMS    PLMS Index     Total # PLMS Arousals    PLMS Arousal Index     Percentage Sleep Time with PLMS   min (   % sleep)  Mean Duration limb movements (secs)

## 2021-06-12 ENCOUNTER — Encounter (INDEPENDENT_AMBULATORY_CARE_PROVIDER_SITE_OTHER): Payer: BC Managed Care – PPO | Admitting: Internal Medicine

## 2021-06-12 DIAGNOSIS — G4733 Obstructive sleep apnea (adult) (pediatric): Secondary | ICD-10-CM

## 2021-06-19 NOTE — Procedures (Signed)
[facility]  Polysomnogram Report Part I  Phone: [phone] Fax: [phone]  Patient Name: Eugene [name], Eugene [name] Acquisition Number: [id number]  Date of Birth: Sep 29, 1966 Acquisition Date: 06/12/2021  Referring Physician: Sallyanne Kuster, FNP-C     History: The patient is a 55 year old male with obstructive sleep apnea for re-titration of CPAP. Medical History: arthritis, hypertension, OSA, hypothyroidism.  Medications: allopurinol, carvedilol, gemfibrozil, levothyroxine, lisinopril.  Procedure: This routine overnight polysomnogram was performed on the Alice 5 using the standard CPAP protocol. This included 6 channels of EEG, 2 channels of EOG, chin EMG, bilateral anterior tibialis EMG, nasal/oral thermistor, PTAF (nasal pressure transducer), chest and abdominal wall movements, EKG, and pulse oximetry.  Description: The total recording time was 389.8 minutes. The total sleep time was 381.0 minutes. There were a total of 7.8 minutes of wakefulness after sleep onset for a very goodsleep efficiency of 97.7%. The latency to sleep onset was shortat 1.0 minutes. The R sleep onset latency was very short at 8.0 minutes. Sleep parameters, as a percentage of the total sleep time, demonstrated 11.4% of sleep was in N1 sleep, 54.9% N2, 0.0% N3 and 33.7% R sleep. There were a total of 18 arousals for an arousal index of 2.8 arousals per hour of sleep that was normal.  Overall, there were a total of 5 respiratory events for a respiratory disturbance index, which includes apneas, hypopneas and RERAs (increased respiratory effort) of 0.8 respiratory events per hour of sleep during the pressure titration. CPAP was initiated at 8 cm H2O for patient comfort  at lights out, 11:00 p.m. It was titrated in 1-2 cm increments for flow limitation to the final pressure of 11 cm H2O. Supine, REM sleep was observed at the final pressure.  Additionally, the baseline oxygen saturation during wakefulness was  96%, during NREM sleep averaged 95%, and during REM sleep averaged 96%. The total duration of oxygen < 90% was 0.0 minutes.  Cardiac monitoring- There were no significant cardiac rhythm irregularities.   Periodic limb movement monitoring- did not demonstrate periodic limb movements.   Impression: This patient's obstructive sleep apnea demonstrated significant improvement with the utilization of nasal CPAP at 11 cm H2O.      Recommendations: Would recommend utilization of nasal CPAP at 11 cm H2O.      An Chiropodist N29  mask, size medium, mask was used. Chin strap-no. Humidifier used during study- yes.     Yevonne Pax, MD, [facility] Diplomate ABMS-Pulmonary, Critical Care and Sleep Medicine  Electronically reviewed and digitally signed      [facility] CPAP/BIPAP Polysomnogram Report Part II Phone: [phone] Fax: [phone]  Patient last name Dutt Neck Size 16.5 in. Acquisition [id number]  Patient first name Eugene [name] Weight 215.0 lbs. Started 06/12/2021 at 10:58:22 PM  Birth date 05-Apr-1966 Height 72.0 in. Stopped 06/13/2021 at 5:33:34 AM  Age 49      Type Adult BMI 29.2 lb/in2 Duration 389.8  MStarke, RPSGT Sleep Data: Lights Out: 11:00:52 PM Sleep Onset: 11:01:52 PM  Lights On: 5:30:40 AM Sleep Efficiency: 97.7 %  Total Recording Time: 389.8 min Sleep Latency (from Lights Off) 1.0 min  Total Sleep Time (TST): 381.0 min R Latency (from Sleep Onset): 8.0 min  Sleep Period Time: 388.5 min Total number of awakenings: 9  Wake during sleep: 7.5 min Wake After Sleep Onset (WASO): 7.8 min   Sleep Data:         Arousal Summary: Stage  Latency from lights out (min) Latency from sleep onset (min) Duration (min) % Total Sleep Time  Normal values  N 1 1.0 0.0 43.5 11.4 (5%)  N 2 6.5 5.5 209.0 54.9 (50%)  N 3       0.0 0.0 (20%)  R 9.0 8.0 128.5 33.7 (25%)    Number Index  Spontaneous 17 2.7  Apneas & Hypopneas 1 0.2  RERAs 0 0.0       (Apneas & Hypopneas & RERAs)  (1)  (0.2)  Limb Movement 1 0.2  Snore 0 0.0  TOTAL 19 3.0     Respiratory Data:  CA OA MA Apnea Hypopnea* A+ H RERA Total  Number 1 0 0 1 4 5  0 5  Mean Dur (sec) 18.0 0.0 0.0 18.0 18.8 18.6 0.0 18.6  Max Dur (sec) 18.0 0.0 0.0 18.0 21.5 21.5 0.0 21.5  Total Dur (min) 0.3 0.0 0.0 0.3 1.3 1.6 0.0 1.6  % of TST 0.1 0.0 0.0 0.1 0.3 0.4 0.0 0.4  Index (#/h TST) 0.2 0.0 0.0 0.2 0.6 0.8 0.0 0.8  *Hypopneas scored based on 4% or greater desaturation.  Sleep Stage:         REM NREM TST  AHI 0.9 0.7 0.8  RDI 0.9 0.7 0.8    Sleep (min) TST (%) REM (min) NREM (min) CA (#) OA (#) MA (#) HYP (#) AHI (#/h) RERA (#) RDI (#/h) Desat (#)  Supine 372.9 97.87 128.3 244.6 1 0 0 4 0.8 0 0.8 15  Non-Supine 8.10 2.13 0.20 7.90 0.00 0.00 0.00 0.00 0.00 0 0.00 0.00  Left: 8.1 2.13 0.2 7.9 0 0 0 0 0.0 0 0.00 0     Snoring: Total number of snoring episodes  0  Total time with snoring    min (   % of sleep)   Oximetry Distribution:             WK REM NREM TOTAL  Average (%)   96 96 95 96  < 90% 0.0 0.0 0.0 0.0  < 80% 0.0 0.0 0.0 0.0  < 70% 0.0 0.0 0.0 0.0  # of Desaturations* 1 5 9 15   Desat Index (#/hour) 7.1 2.3 2.1 2.4  Desat Max (%) 5 4 6 6   Desat Max Dur (sec) 36.0 45.0 37.0 45.0  Approx Min O2 during sleep 91  Approx min O2 during a respiratory event 91  Was Oxygen added (Y/N) and final rate No:   0 LPM  *Desaturations based on 3% or greater drop from baseline.   Cheyne Stokes Breathing: None Present    Heart Rate Summary:  Average Heart Rate During Sleep 52.0 bpm      Highest Heart Rate During Sleep (95th %) 59.0 bpm      Highest Heart Rate During Sleep 161 bpm (artifact)  Highest Heart Rate During Recording (TIB) 161 bpm (artifact)   Heart Rate Observations: Event Type # Events   Bradycardia 0 Lowest HR Scored: N/A  Sinus Tachycardia During Sleep 0 Highest HR Scored: N/A  Narrow Complex Tachycardia 0 Highest HR Scored: N/A  Wide Complex Tachycardia 0 Highest HR  Scored: N/A  Asystole 0 Longest Pause: N/A  Atrial Fibrillation 0 Duration Longest Event: N/A  Other Arrythmias  No Type:   Periodic Limb Movement Data: (Primary legs unless otherwise noted) Total # Limb Movement 1 Limb Movement Index 0.2  Total # PLMS    PLMS Index     Total # PLMS Arousals  PLMS Arousal Index     Percentage Sleep Time with PLMS   min (   % sleep)  Mean Duration limb movements (secs)       IPAP Level (cmH2O) EPAP Level (cmH2O) Total Duration (min) Sleep Duration (min) Sleep (%) REM (%) CA  #) OA # MA # HYP #) AHI (#/hr) RERAs # RERAs (#/hr) RDI (#/hr)  8 8 119.9 118.9 99.2 29.1 0 0 0 0 0.0 0 0.0 0.0  10 10 191.8 187.3 97.7 26.5 1 0 0 3 1.3 0 0.0 1.3  11 11  73.1 71.6 97.9 56.2 0 0 0 0 0.0 0 0.0 0.0  12 12 2.8 2.3 82.1 39.3 0 0 0 1 26.1 0 0.0 26.1

## 2021-07-02 LAB — HM DEXA SCAN

## 2021-07-22 ENCOUNTER — Telehealth: Payer: Self-pay

## 2021-07-22 NOTE — Telephone Encounter (Signed)
Patients CPAP order was sent over to [facility].

## 2021-07-30 ENCOUNTER — Telehealth: Payer: Self-pay

## 2021-07-30 NOTE — Telephone Encounter (Signed)
Patients order has been confirmed and received by [facility]. I advised to the patient they should be hearing from [facility] soon.

## 2021-08-26 ENCOUNTER — Ambulatory Visit (INDEPENDENT_AMBULATORY_CARE_PROVIDER_SITE_OTHER): Payer: BC Managed Care – PPO

## 2021-08-26 ENCOUNTER — Other Ambulatory Visit: Payer: Self-pay

## 2021-08-26 DIAGNOSIS — G4733 Obstructive sleep apnea (adult) (pediatric): Secondary | ICD-10-CM | POA: Diagnosis not present

## 2021-08-26 NOTE — Progress Notes (Signed)
He received a new cpap  luna G3 at 11 cmh2o ,humidifier, tubing and filters. He was not eligible for supplies yet. He also has received his new cpap from [facility] and I set it to pressure of 11.pt was seen by kelly from [facility]

## 2021-11-11 ENCOUNTER — Ambulatory Visit: Payer: BC Managed Care – PPO

## 2021-11-11 DIAGNOSIS — G4733 Obstructive sleep apnea (adult) (pediatric): Secondary | ICD-10-CM | POA: Diagnosis not present

## 2021-11-11 NOTE — Progress Notes (Signed)
95 percentile pressure 11   95th percentile leak 38.7     apnea-hypopnea index  0.8 /hr   total days used  >4 hr 23 days  total days used <4 hr 7 days  Total compliance 76 percent  Problems with humidifier  set it to auto  no other problems or questions.     Pt was seen by Tresa Endo  RRT/RCP  from [facility]

## 2021-12-09 ENCOUNTER — Ambulatory Visit
Admission: EM | Admit: 2021-12-09 | Discharge: 2021-12-09 | Disposition: A | Discharge status: Home / Self Care | Payer: BC Managed Care – PPO | Attending: Medical Oncology | Admitting: Medical Oncology

## 2021-12-09 ENCOUNTER — Other Ambulatory Visit: Payer: Self-pay

## 2021-12-09 ENCOUNTER — Encounter: Payer: Self-pay | Admitting: Licensed Clinical Social Worker

## 2021-12-09 DIAGNOSIS — J029 Acute pharyngitis, unspecified: Secondary | ICD-10-CM | POA: Diagnosis not present

## 2021-12-09 DIAGNOSIS — Z87891 Personal history of nicotine dependence: Secondary | ICD-10-CM | POA: Insufficient documentation

## 2021-12-09 DIAGNOSIS — Z20822 Contact with and (suspected) exposure to covid-19: Secondary | ICD-10-CM | POA: Diagnosis not present

## 2021-12-09 DIAGNOSIS — R059 Cough, unspecified: Secondary | ICD-10-CM | POA: Insufficient documentation

## 2021-12-09 DIAGNOSIS — J069 Acute upper respiratory infection, unspecified: Secondary | ICD-10-CM

## 2021-12-09 LAB — RESP PANEL BY RT-PCR (FLU A&B, COVID) ARPGX2
Influenza A by PCR: NEGATIVE
Influenza B by PCR: NEGATIVE
SARS Coronavirus 2 by RT PCR: NEGATIVE

## 2021-12-09 MED ORDER — PROMETHAZINE-DM 6.25-15 MG/5ML PO SYRP: 5.0000 mL | ORAL_SOLUTION | Freq: Four times a day (QID) | ORAL | 0 refills | Status: DC | PRN

## 2021-12-09 MED ORDER — IPRATROPIUM BROMIDE 0.06 % NA SOLN: 2.0000 | Freq: Four times a day (QID) | NASAL | 12 refills | Status: DC

## 2021-12-09 MED ORDER — BENZONATATE 100 MG PO CAPS: 200.0000 mg | ORAL_CAPSULE | Freq: Three times a day (TID) | ORAL | 0 refills | Status: DC

## 2021-12-09 NOTE — Discharge Instructions (Addendum)

## 2021-12-09 NOTE — ED Triage Notes (Signed)
Pt c/o sore throat x 1 week, cough and congestion x 3 days.

## 2021-12-09 NOTE — ED Provider Notes (Signed)
[facility]    CSN: [id number] Arrival date & time: 12/09/21  1449      History   Chief Complaint Chief Complaint  Patient presents with   Cough   Sore Throat   Nasal Congestion    HPI Eugene [name] is a 56 y.o. male.   HPI  24 old male here for evaluation of respiratory complaints.  Patient reports that he has been experiencing sore throat for the past week.  He reports that his symptoms were starting to improve and then 3 days ago she developed chills, runny nose, nasal congestion, ear pain, productive cough yellow sputum, shortness of breath, wheezing, and diarrhea.  He has not measured a fever at home.  He denies nausea or vomiting.  Past Medical History:  Diagnosis Date   Arthritis    Hypertension    Sleep apnea    Thyroid disease     Patient Active Problem List   Diagnosis Date Noted   OSA (obstructive sleep apnea) 07/10/2018    Past Surgical History:  Procedure Laterality Date   knee sugery Right    left elbow surgery     SHOULDER ARTHROSCOPY         Home Medications    Prior to Admission medications   Medication Sig Start Date End Date Taking? Authorizing Provider  allopurinol (ZYLOPRIM) 100 MG tablet Take 100 mg by mouth daily.   Yes [provider]  benzonatate (TESSALON) 100 MG capsule Take 2 capsules (200 mg total) by mouth every 8 (eight) hours. 12/09/21  Yes Becky Augusta, NP  carvedilol (COREG) 12.5 MG tablet Take 12.5 mg by mouth 2 (two) times daily with a meal.   Yes [provider]  GEMFIBROZIL PO Take by mouth.   Yes [provider]  ipratropium (ATROVENT) 0.06 % nasal spray Place 2 sprays into both nostrils 4 (four) times daily. 12/09/21  Yes Becky Augusta, NP  levothyroxine (SYNTHROID, LEVOTHROID) 25 MCG tablet Take 25 mcg by mouth daily before breakfast.   Yes [provider]  lisinopril (PRINIVIL,ZESTRIL) 10 MG tablet Take 10 mg by mouth daily.   Yes [provider]  Omega-3 Fatty  Acids (FISH-EPA PO) Take by mouth.   Yes [provider]  promethazine-dextromethorphan (PROMETHAZINE-DM) 6.25-15 MG/5ML syrup Take 5 mLs by mouth 4 (four) times daily as needed. 12/09/21  Yes Becky Augusta, NP    Family History Family History  Problem Relation Age of Onset   Hypertension Father     Social History Social History   Tobacco Use   Smoking status: Former   Smokeless tobacco: Never  Building services engineer Use: Never used  Substance Use Topics   Alcohol use: Yes    Comment: beer 1-2 a week   Drug use: No     Allergies   Patient has no known allergies.   Review of Systems Review of Systems  Constitutional:  Positive for chills. Negative for activity change, appetite change and fever.  HENT:  Positive for congestion, ear pain, rhinorrhea and sore throat.   Respiratory:  Positive for cough, shortness of breath and wheezing.   Gastrointestinal:  Positive for diarrhea. Negative for nausea and vomiting.  Skin:  Negative for rash.  Hematological: Negative.   Psychiatric/Behavioral: Negative.      Physical Exam Triage Vital Signs ED Triage Vitals  Enc Vitals Group     BP 12/09/21 1554 (!) 156/87     Pulse Rate 12/09/21 1554 (!) 58  Resp 12/09/21 1554 16     Temp 12/09/21 1554 98.5 F (36.9 C)     Temp Source 12/09/21 1554 Oral     SpO2 12/09/21 1554 99 %     Weight 12/09/21 1551 215 lb 13.3 oz (97.9 kg)     Height 12/09/21 1551 6' (1.829 m)     Head Circumference --      Peak Flow --      Pain Score 12/09/21 1551 5     Pain Loc --      Pain Edu? --      Excl. in GC? --    No data found.  Updated Vital Signs BP (!) 156/87 (BP Location: Left Arm)    Pulse (!) 58    Temp 98.5 F (36.9 C) (Oral)    Resp 16    Ht 6' (1.829 m)    Wt 215 lb 13.3 oz (97.9 kg)    SpO2 99%    BMI 29.27 kg/m   Visual Acuity Right Eye Distance:   Left Eye Distance:   Bilateral Distance:    Right Eye Near:   Left Eye Near:    Bilateral Near:     Physical  Exam Vitals and nursing note reviewed.  Constitutional:      General: He is not in acute distress.    Appearance: Normal appearance. He is not ill-appearing.  HENT:     Head: Normocephalic and atraumatic.     Right Ear: Tympanic membrane, ear canal and external ear normal. There is no impacted cerumen.     Left Ear: Tympanic membrane, ear canal and external ear normal. There is no impacted cerumen.     Nose: Congestion and rhinorrhea present.     Mouth/Throat:     Mouth: Mucous membranes are moist.     Pharynx: Oropharynx is clear. Posterior oropharyngeal erythema present.  Cardiovascular:     Rate and Rhythm: Normal rate and regular rhythm.     Pulses: Normal pulses.     Heart sounds: Normal heart sounds. No murmur heard.   No friction rub. No gallop.  Pulmonary:     Effort: Pulmonary effort is normal.     Breath sounds: Wheezing present. No rhonchi or rales.  Musculoskeletal:     Cervical back: Normal range of motion and neck supple.  Lymphadenopathy:     Cervical: No cervical adenopathy.  Skin:    General: Skin is warm and dry.     Capillary Refill: Capillary refill takes less than 2 seconds.     Findings: No erythema or rash.  Neurological:     General: No focal deficit present.     Mental Status: He is alert and oriented to person, place, and time.  Psychiatric:        Mood and Affect: Mood normal.        Behavior: Behavior normal.        Thought Content: Thought content normal.        Judgment: Judgment normal.     UC Treatments / Results  Labs (all labs ordered are listed, but only abnormal results are displayed) Labs Reviewed  RESP PANEL BY RT-PCR (FLU A&B, COVID) [id number]    EKG   Radiology No results found.  Procedures Procedures (including critical care time)  Medications Ordered in UC Medications - No data to display  Initial Impression / Assessment and Plan / UC Course  I have reviewed the triage vital signs and the nursing notes.  Pertinent  labs & imaging results that were available during my care of the patient were reviewed by me and considered in my medical decision making (see chart for details).  Patient is a very pleasant, nontoxic-appearing 56 year old male here for evaluation of respiratory complaints as outlined in HPI above.  Symptoms began with a sore throat which is started to improve before the remainder of his upper respiratory symptoms began.  His physical exam reveals pearly gray tympanic membranes bilaterally with normal light reflex and clear external auditory canals.  Nasal mucosa is erythematous and edematous with clear discharge in both nares.  Oropharyngeal exam reveals posterior oropharyngeal erythema and injection with clear postnasal drip.  No cervical lymphadenopathy appreciated on exam.  Cardiopulmonary exam reveals scattered wheezes in predominantly the right lung fields but also with the middle left.  No rales or rhonchi noted.  Respiratory triplex panel was collected at triage.  Respiratory triplex panel is negative for COVID or influenza.  Patient Damas consistent with a upper respiratory infection, most likely viral.  We will treat with Atrovent nasal spray, Tessalon Perles, Promethazine DM cough syrup.   Final Clinical Impressions(s) / UC Diagnoses   Final diagnoses:  Viral URI with cough     Discharge Instructions      Use the Atrovent nasal spray, 2 squirts in each nostril every 6 hours, as needed for runny nose and postnasal drip.  Use the Tessalon Perles every 8 hours during the day.  Take them with a small sip of water.  They may give you some numbness to the base of your tongue or a metallic taste in your mouth, this is normal.  Use the Promethazine DM cough syrup at bedtime for cough and congestion.  It will make you drowsy so do not take it during the day.  Return for reevaluation or see your primary care provider for any new or worsening symptoms.      ED Prescriptions      Medication Sig Dispense Auth. Provider   benzonatate (TESSALON) 100 MG capsule Take 2 capsules (200 mg total) by mouth every 8 (eight) hours. 21 capsule Becky Augusta, NP   ipratropium (ATROVENT) 0.06 % nasal spray Place 2 sprays into both nostrils 4 (four) times daily. 15 mL Becky Augusta, NP   promethazine-dextromethorphan (PROMETHAZINE-DM) 6.25-15 MG/5ML syrup Take 5 mLs by mouth 4 (four) times daily as needed. 118 mL Becky Augusta, NP      PDMP not reviewed this encounter.   Becky Augusta, NP 12/09/21 [phone]

## 2021-12-15 ENCOUNTER — Encounter: Payer: Self-pay | Admitting: Internal Medicine

## 2021-12-15 ENCOUNTER — Other Ambulatory Visit: Payer: Self-pay

## 2021-12-15 ENCOUNTER — Ambulatory Visit: Payer: BC Managed Care – PPO | Admitting: Internal Medicine

## 2021-12-15 VITALS — BP 148/92 | HR 70 | Temp 97.8°F | Resp 16 | Ht 72.0 in | Wt 229.0 lb

## 2021-12-15 DIAGNOSIS — Z7189 Other specified counseling: Secondary | ICD-10-CM | POA: Diagnosis not present

## 2021-12-15 DIAGNOSIS — G4733 Obstructive sleep apnea (adult) (pediatric): Secondary | ICD-10-CM | POA: Diagnosis not present

## 2021-12-15 NOTE — Progress Notes (Signed)
[facility] [place], [place] [address]  Pulmonary Sleep Medicine   Office Visit Note  Patient Name: Eugene [name] DOB: 1966-10-25 MRN [id number]  Date of Service: 12/15/2021  Complaints/HPI: OSA excellent compliance. He states that he has received his machine and is doing well with it. Patient states he has no issues with maskfit etc. He has an AHI of 0.8 per hour on the download. Patient has low mask leak and has 76% compliance  ROS  General: (-) fever, (-) chills, (-) night sweats, (-) weakness Skin: (-) rashes, (-) itching,. Eyes: (-) visual changes, (-) redness, (-) itching. Nose and Sinuses: (-) nasal stuffiness or itchiness, (-) postnasal drip, (-) nosebleeds, (-) sinus trouble. Mouth and Throat: (-) sore throat, (-) hoarseness. Neck: (-) swollen glands, (-) enlarged thyroid, (-) neck pain. Respiratory: - cough, (-) bloody sputum, - shortness of breath, - wheezing. Cardiovascular: - ankle swelling, (-) chest pain. Lymphatic: (-) lymph node enlargement. Neurologic: (-) numbness, (-) tingling. Psychiatric: (-) anxiety, (-) depression   Current Medication: Outpatient Encounter Medications as of 12/15/2021  Medication Sig   allopurinol (ZYLOPRIM) 100 MG tablet Take 100 mg by mouth daily.   benzonatate (TESSALON) 100 MG capsule Take 2 capsules (200 mg total) by mouth every 8 (eight) hours.   carvedilol (COREG) 12.5 MG tablet Take 12.5 mg by mouth 2 (two) times daily with a meal.   GEMFIBROZIL PO Take by mouth.   ipratropium (ATROVENT) 0.06 % nasal spray Place 2 sprays into both nostrils 4 (four) times daily.   levothyroxine (SYNTHROID, LEVOTHROID) 25 MCG tablet Take 25 mcg by mouth daily before breakfast.   lisinopril (PRINIVIL,ZESTRIL) 10 MG tablet Take 10 mg by mouth daily.   Omega-3 Fatty Acids (FISH-EPA PO) Take by mouth.   promethazine-dextromethorphan (PROMETHAZINE-DM) 6.25-15 MG/5ML syrup Take 5 mLs by mouth 4 (four) times daily as needed.    No facility-administered encounter medications on file as of 12/15/2021.    Surgical History: Past Surgical History:  Procedure Laterality Date   knee sugery Right    left elbow surgery     SHOULDER ARTHROSCOPY      Medical History: Past Medical History:  Diagnosis Date   Arthritis    Hypertension    Sleep apnea    Thyroid disease     Family History: Family History  Problem Relation Age of Onset   Hypertension Father     Social History: Social History   Socioeconomic History   Marital status: Married    Spouse name: Not on file   Number of children: Not on file   Years of education: Not on file   Highest education level: Not on file  Occupational History   Not on file  Tobacco Use   Smoking status: Former   Smokeless tobacco: Never  Vaping Use   Vaping Use: Never used  Substance and Sexual Activity   Alcohol use: Yes    Comment: beer 1-2 a week   Drug use: No   Sexual activity: Not on file  Other Topics Concern   Not on file  Social History Narrative   Not on file   Social Determinants of Health   Financial Resource Strain: Not on file  Food Insecurity: Not on file  Transportation Needs: Not on file  Physical Activity: Not on file  Stress: Not on file  Social Connections: Not on file  Intimate Partner Violence: Not on file    Vital Signs: Blood pressure (!) 148/92, pulse 70, temperature  97.8 F (36.6 C), resp. rate 16, height 6' (1.829 m), weight 229 lb (103.9 kg), SpO2 97 %.  Examination: General Appearance: The patient is well-developed, well-nourished, and in no distress. Skin: Gross inspection of skin unremarkable. Head: normocephalic, no gross deformities. Eyes: no gross deformities noted. ENT: ears appear grossly normal no exudates. Neck: Supple. No thyromegaly. No LAD. Respiratory: no rhonchi noted. Cardiovascular: Normal S1 and S2 without murmur or rub. Extremities: No cyanosis. pulses are equal. Neurologic: Alert and oriented.  No involuntary movements.  LABS: Recent Results (from the past 2160 hour(s))  Resp Panel by RT-PCR (Flu A&B, Covid) Nasopharyngeal Swab     Status: None   Collection Time: 12/09/21  3:56 PM   Specimen: Nasopharyngeal Swab; Nasopharyngeal(NP) swabs in vial transport medium  Result Value Ref Range   SARS Coronavirus 2 by RT PCR NEGATIVE NEGATIVE    Comment: (NOTE) SARS-CoV-2 target nucleic acids are NOT DETECTED.  The SARS-CoV-2 RNA is generally detectable in upper respiratory specimens during the acute phase of infection. The lowest concentration of SARS-CoV-2 viral copies this assay can detect is 138 copies/mL. A negative result does not preclude SARS-Cov-2 infection and should not be used as the sole basis for treatment or other patient management decisions. A negative result may occur with  improper specimen collection/handling, submission of specimen other than nasopharyngeal swab, presence of viral mutation(s) within the areas targeted by this assay, and inadequate number of viral copies(<138 copies/mL). A negative result must be combined with clinical observations, patient history, and epidemiological information. The expected result is Negative.  Fact Sheet for Patients:  EntrepreneurPulse.com.au  Fact Sheet for Healthcare Providers:  IncredibleEmployment.be  This test is no t yet approved or cleared by the [place] FDA and  has been authorized for detection and/or diagnosis of SARS-CoV-2 by FDA under an Emergency Use Authorization (EUA). This EUA will remain  in effect (meaning this test can be used) for the duration of the COVID-19 declaration under Section 564(b)(1) of the Act, 21 U.S.C.section 360bbb-3(b)(1), unless the authorization is terminated  or revoked sooner.       Influenza A by PCR NEGATIVE NEGATIVE   Influenza B by PCR NEGATIVE NEGATIVE    Comment: (NOTE) The Xpert Xpress SARS-CoV-2/FLU/RSV plus assay is intended  as an aid in the diagnosis of influenza from Nasopharyngeal swab specimens and should not be used as a sole basis for treatment. Nasal washings and aspirates are unacceptable for Xpert Xpress SARS-CoV-2/FLU/RSV testing.  Fact Sheet for Patients: EntrepreneurPulse.com.au  Fact Sheet for Healthcare Providers: IncredibleEmployment.be  This test is not yet approved or cleared by the [place] FDA and has been authorized for detection and/or diagnosis of SARS-CoV-2 by FDA under an Emergency Use Authorization (EUA). This EUA will remain in effect (meaning this test can be used) for the duration of the COVID-19 declaration under Section 564(b)(1) of the Act, 21 U.S.C. section 360bbb-3(b)(1), unless the authorization is terminated or revoked.  Performed at [facility], [address], [place], [place] [address]     Radiology: No results found.  No results found.  No results found.    Assessment and Plan: Patient Active Problem List   Diagnosis Date Noted   OSA (obstructive sleep apnea) 07/10/2018    1. Obstructive sleep apnea Patient is to continue using the CPAP therapy as prescribed.  Missed work on his mask leak as well as compliance.  Right now compliance with 76% he could improve that I think significantly with usage  2. CPAP use counseling CPAP Counseling: had a lengthy discussion with the patient regarding the importance of PAP therapy in management of the sleep apnea. Patient appears to understand the risk factor reduction and also understands the risks associated with untreated sleep apnea. Patient will try to make a good faith effort to remain compliant with therapy. Also instructed the patient on proper cleaning of the device including the water must be changed daily if possible and use of distilled water is preferred. Patient understands that the machine should be regularly cleaned with appropriate recommended  cleaning solutions that do not damage the PAP machine for example given white vinegar and water rinses. Other methods such as ozone treatment may not be as good as these simple methods to achieve cleaning.   3. Obesity, morbid ([place]) Obesity Counseling: Had a lengthy discussion regarding patients BMI and weight issues. Patient was instructed on portion control as well as increased activity. Also discussed caloric restrictions with trying to maintain intake less than 2000 Kcal. Discussions were made in accordance with the 5As of weight management. Simple actions such as not eating late and if able to, taking a walk is suggested.     General Counseling: I have discussed the findings of the evaluation and examination with Eugene [name].  I have also discussed any further diagnostic evaluation thatmay be needed or ordered today. Eugene [name] verbalizes understanding of the findings of todays visit. We also reviewed his medications today and discussed drug interactions and side effects including but not limited excessive drowsiness and altered mental states. We also discussed that there is always a risk not just to him but also people around him. he has been encouraged to call the office with any questions or concerns that should arise related to todays visit.  No orders of the defined types were placed in this encounter.    Time spent: 34  I have personally obtained a history, examined the patient, evaluated laboratory and imaging results, formulated the assessment and plan and placed orders.    Allyne Gee, MD [facility] Pulmonary and Critical Care Sleep medicine

## 2021-12-15 NOTE — Patient Instructions (Signed)
Sleep Apnea ?Sleep apnea affects breathing during sleep. It causes breathing to stop for 10 seconds or more, or to become shallow. People with sleep apnea usually snore loudly. ?It can also increase the risk of: ?Heart attack. ?Stroke. ?Being very overweight (obese). ?Diabetes. ?Heart failure. ?Irregular heartbeat. ?High blood pressure. ?The goal of treatment is to help you breathe normally again. ?What are the causes? ?The most common cause of this condition is a collapsed or blocked airway. ?There are three kinds of sleep apnea: ?Obstructive sleep apnea. This is caused by a blocked or collapsed airway. ?Central sleep apnea. This happens when the brain does not send the right signals to the muscles that control breathing. ?Mixed sleep apnea. This is a combination of obstructive and central sleep apnea. ?What increases the risk? ?Being overweight. ?Smoking. ?Having a small airway. ?Being older. ?Being male. ?Drinking alcohol. ?Taking medicines to calm yourself (sedatives or tranquilizers). ?Having family members with the condition. ?Having a tongue or tonsils that are larger than normal. ?What are the signs or symptoms? ?Trouble staying asleep. ?Loud snoring. ?Headaches in the morning. ?Waking up gasping. ?Dry mouth or sore throat in the morning. ?Being sleepy or tired during the day. ?If you are sleepy or tired during the day, you may also: ?Not be able to focus your mind (concentrate). ?Forget things. ?Get angry a lot and have mood swings. ?Feel sad (depressed). ?Have changes in your personality. ?Have less interest in sex, if you are male. ?Be unable to have an erection, if you are male. ?How is this treated? ? ?Sleeping on your side. ?Using a medicine to get rid of mucus in your nose (decongestant). ?Avoiding the use of alcohol, medicines to help you relax, or certain pain medicines (narcotics). ?Losing weight, if needed. ?Changing your diet. ?Quitting smoking. ?Using a machine to open your airway while you  sleep, such as: ?An oral appliance. This is a mouthpiece that shifts your lower jaw forward. ?A CPAP device. This device blows air through a mask when you breathe out (exhale). ?An EPAP device. This has valves that you put in each nostril. ?A BIPAP device. This device blows air through a mask when you breathe in (inhale) and breathe out. ?Having surgery if other treatments do not work. ?Follow these instructions at home: ?Lifestyle ?Make changes that your doctor recommends. ?Eat a healthy diet. ?Lose weight if needed. ?Avoid alcohol, medicines to help you relax, and some pain medicines. ?Do not smoke or use any products that contain nicotine or tobacco. If you need help quitting, ask your doctor. ?General instructions ?Take over-the-counter and prescription medicines only as told by your doctor. ?If you were given a machine to use while you sleep, use it only as told by your doctor. ?If you are having surgery, make sure to tell your doctor you have sleep apnea. You may need to bring your device with you. ?Keep all follow-up visits. ?Contact a doctor if: ?The machine that you were given to use during sleep bothers you or does not seem to be working. ?You do not get better. ?You get worse. ?Get help right away if: ?Your chest hurts. ?You have trouble breathing in enough air. ?You have an uncomfortable feeling in your back, arms, or stomach. ?You have trouble talking. ?One side of your body feels weak. ?A part of your face is hanging down. ?These symptoms may be an emergency. Get help right away. Call your local emergency services (911 in the [place] ?Do not wait to see if the symptoms   will go away. ?Do not drive yourself to the hospital. ?Summary ?This condition affects breathing during sleep. ?The most common cause is a collapsed or blocked airway. ?The goal of treatment is to help you breathe normally while you sleep. ?This information is not intended to replace advice given to you by your health care provider. Make  sure you discuss any questions you have with your health care provider. ?Document Revised: 07/01/2021 Document Reviewed: 10/31/2020 ?Elsevier Patient Education ? 2022 [employer]vier Inc. ? ?

## 2022-01-25 DIAGNOSIS — S76119A Strain of unspecified quadriceps muscle, fascia and tendon, initial encounter: Secondary | ICD-10-CM | POA: Insufficient documentation

## 2022-01-25 DIAGNOSIS — R269 Unspecified abnormalities of gait and mobility: Secondary | ICD-10-CM | POA: Insufficient documentation

## 2022-01-25 DIAGNOSIS — M25569 Pain in unspecified knee: Secondary | ICD-10-CM | POA: Insufficient documentation

## 2022-01-25 DIAGNOSIS — M25669 Stiffness of unspecified knee, not elsewhere classified: Secondary | ICD-10-CM | POA: Insufficient documentation

## 2022-02-21 ENCOUNTER — Other Ambulatory Visit: Payer: Self-pay

## 2022-02-21 ENCOUNTER — Ambulatory Visit
Admission: EM | Admit: 2022-02-21 | Discharge: 2022-02-21 | Disposition: A | Discharge status: Home / Self Care | Payer: BC Managed Care – PPO

## 2022-02-21 DIAGNOSIS — J209 Acute bronchitis, unspecified: Secondary | ICD-10-CM

## 2022-02-21 DIAGNOSIS — J069 Acute upper respiratory infection, unspecified: Secondary | ICD-10-CM | POA: Diagnosis not present

## 2022-02-21 MED ORDER — AEROCHAMBER MV MISC: 2 refills | Status: DC

## 2022-02-21 MED ORDER — ALBUTEROL SULFATE HFA 108 (90 BASE) MCG/ACT IN AERS
2.0000 | INHALATION_SPRAY | RESPIRATORY_TRACT | 0 refills | Status: DC | PRN
Start: 2022-02-21 — End: 2022-05-17

## 2022-02-21 MED ORDER — DOXYCYCLINE HYCLATE 100 MG PO CAPS: 100.0000 mg | ORAL_CAPSULE | Freq: Two times a day (BID) | ORAL | 0 refills | Status: DC

## 2022-02-21 MED ORDER — IPRATROPIUM BROMIDE 0.06 % NA SOLN: 2.0000 | Freq: Four times a day (QID) | NASAL | 12 refills | Status: DC

## 2022-02-21 MED ORDER — PROMETHAZINE-DM 6.25-15 MG/5ML PO SYRP: 5.0000 mL | ORAL_SOLUTION | Freq: Four times a day (QID) | ORAL | 0 refills | Status: DC | PRN

## 2022-02-21 MED ORDER — BENZONATATE 100 MG PO CAPS
200.0000 mg | ORAL_CAPSULE | Freq: Three times a day (TID) | ORAL | 0 refills | Status: DC
Start: 2022-02-21 — End: 2023-07-04

## 2022-02-21 MED ORDER — PREDNISONE 20 MG PO TABS: 60.0000 mg | ORAL_TABLET | Freq: Every day | ORAL | 0 refills | Status: AC

## 2022-02-21 NOTE — ED Triage Notes (Signed)
Patient is here for "congestion, st". Here in Jan. With similar symptoms. "This time, started a week ago, congestion with productive cough, st from the cough". Some ha. Chills. Fever?Marland Kitchen No @ home COVID19 testing.  ?

## 2022-02-21 NOTE — Discharge Instructions (Addendum)
Use the albuterol inhaler/nebulizer every 4-6 hours as needed for shortness of breath, wheezing, and cough.  Take the prednisone according to the package instructions to help with pulmonary inflammation.  Use the Tessalon Perles every 8 hours for your cough.  Taken with a small sip of water.  They may give you some numbness to the base of your tongue or metallic taste in her mouth, this is normal.  They are designed to calm down the cough reflex.  Use the Promethazine DM cough syrup at bedtime as will make you drowsy.  You may take 1 teaspoon (5 mL) every 6 hours.  Return for reevaluation for new or worsening symptoms.  

## 2022-02-21 NOTE — ED Provider Notes (Signed)
[facility]    CSN: [id number] Arrival date & time: 02/21/22  1513      History   Chief Complaint Chief Complaint  Patient presents with   Nasal Congestion    HPI Eugene [name] is a 56 y.o. male.   HPI  56 year old male here for evaluation of respiratory complaints.  For sinus symptoms began a week ago and they consist of significant nasal congestion with yellow nasal discharge, ear soreness, productive cough for yellow sputum, sore throat that he attributes to the cough, chills, and wheezing.  He does use a CPAP for sleep apnea and indicates that he cleans his reservoir and mask regularly and uses distilled water for humidification.  He has been using Delsym without any significant improvement of his symptoms.  He denies any shortness of breath.  Past Medical History:  Diagnosis Date   Arthritis    Hypertension    Sleep apnea    Thyroid disease     Patient Active Problem List   Diagnosis Date Noted   Abnormal gait 01/25/2022   Knee pain 01/25/2022   Knee stiff 01/25/2022   Rupture of quadriceps tendon 01/25/2022   OSA (obstructive sleep apnea) 07/10/2018   Rupture of tendon of biceps, long head 06/12/2018   Shoulder strain 06/12/2018    Past Surgical History:  Procedure Laterality Date   knee sugery Right    left elbow surgery     SHOULDER ARTHROSCOPY         Home Medications    Prior to Admission medications   Medication Sig Start Date End Date Taking? Authorizing Provider  albuterol (VENTOLIN HFA) 108 (90 Base) MCG/ACT inhaler Inhale 2 puffs into the lungs every 4 (four) hours as needed. 02/21/22  Yes Becky Augusta, NP  allopurinol (ZYLOPRIM) 100 MG tablet Take 100 mg by mouth daily.   Yes [provider]  benzonatate (TESSALON) 100 MG capsule Take 2 capsules (200 mg total) by mouth every 8 (eight) hours. 02/21/22  Yes Becky Augusta, NP  carvedilol (COREG) 12.5 MG tablet Take 12.5 mg by mouth 2 (two) times daily with a meal.   Yes  [provider]  doxycycline (VIBRAMYCIN) 100 MG capsule Take 1 capsule (100 mg total) by mouth 2 (two) times daily. 02/21/22  Yes Becky Augusta, NP  ergocalciferol (VITAMIN D2) 1.25 MG (50000 UT) capsule ergocalciferol (vitamin D2) 1,250 mcg (50,000 unit) capsule  TAKE 1 CAPSULE BY MOUTH WEEKLY   Yes [provider]  finasteride (PROPECIA) 1 MG tablet Take 1 tablet by mouth daily.   Yes [provider]  fluticasone (FLONASE) 50 MCG/ACT nasal spray fluticasone propionate 50 mcg/actuation nasal spray,suspension   Yes [provider]  furosemide (LASIX) 20 MG tablet furosemide 20 mg tablet   Yes [provider]  gemfibrozil (LOPID) 600 MG tablet gemfibrozil 600 mg tablet   Yes [provider]  ipratropium (ATROVENT) 0.06 % nasal spray Place 2 sprays into both nostrils 4 (four) times daily. 02/21/22  Yes Becky Augusta, NP  levothyroxine (SYNTHROID, LEVOTHROID) 25 MCG tablet Take 25 mcg by mouth daily before breakfast.   Yes [provider]  lisinopril-hydrochlorothiazide (ZESTORETIC) 20-25 MG tablet lisinopril 20 mg-hydrochlorothiazide 25 mg tablet  TAKE 1 TABLET BY MOUTH EVERY DAY   Yes [provider]  minoxidil (LONITEN) 2.5 MG tablet Take 2.5 mg by mouth daily. 01/20/22  Yes [provider]  mometasone (NASONEX) 50 MCG/ACT nasal spray mometasone 50 mcg/actuation nasal spray   Yes [provider]  Multiple Vitamin (MULTIVITAMIN) capsule Take 1 capsule by mouth daily.   Yes [provider]  naproxen (EC NAPROSYN) 500 MG EC tablet EC-Naproxen 500 mg tablet,delayed release  TAKE 1 TABLET BY MOUTH TWICE A DAY   Yes [provider]  olopatadine (PATANOL) 0.1 % ophthalmic solution olopatadine 0.1 % eye drops   Yes [provider]  Omega-3 Fatty Acids (FISH-EPA PO) Take by mouth.   Yes [provider]  predniSONE (DELTASONE) 20 MG tablet Take 3 tablets (60 mg total) by mouth daily with  breakfast for 5 days. 3 tablets daily for 5 days. 02/21/22 02/26/22 Yes Becky Augusta, NP  promethazine-dextromethorphan (PROMETHAZINE-DM) 6.25-15 MG/5ML syrup Take 5 mLs by mouth 4 (four) times daily as needed. 02/21/22  Yes Becky Augusta, NP  Spacer/Aero-Holding Chambers (AEROCHAMBER MV) inhaler Use as instructed 02/21/22  Yes Becky Augusta, NP  testosterone enanthate (DELATESTRYL) 200 MG/ML injection Inject into the muscle.   Yes [provider]  atorvastatin (LIPITOR) 10 MG tablet atorvastatin 10 mg tablet    [provider]  atorvastatin (LIPITOR) 80 MG tablet atorvastatin 80 mg tablet    [provider]  hydrocortisone 2.5 % cream hydrocortisone 2.5 % topical cream    [provider]  ketoconazole (NIZORAL) 2 % shampoo ketoconazole 2 % shampoo    [provider]  METHYLPREDNISOLONE PO methylprednisolone 4 mg tablets in a dose pack    [provider]    Family History Family History  Problem Relation Age of Onset   Hypertension Father     Social History Social History   Tobacco Use   Smoking status: Former   Smokeless tobacco: Never  Building services engineer Use: Never used  Substance Use Topics   Alcohol use: Yes    Comment: beer 1-2 a week   Drug use: No     Allergies   Other and Cat hair extract   Review of Systems Review of Systems  Constitutional:  Negative for fever.  HENT:  Positive for congestion, ear pain, rhinorrhea and sore throat.   Respiratory:  Positive for cough and wheezing.   Gastrointestinal:  Negative for diarrhea, nausea and vomiting.  Hematological: Negative.   Psychiatric/Behavioral: Negative.      Physical Exam Triage Vital Signs ED Triage Vitals [02/21/22 1522]  Enc Vitals Group     BP      Pulse      Resp      Temp      Temp src      SpO2      Weight 220 lb (99.8 kg)     Height 6' (1.829 m)     Head Circumference      Peak Flow      Pain Score 0     Pain Loc      Pain Edu?      Excl.  in GC?    No data found.  Updated Vital Signs BP (!) 147/89 (BP Location: Left Arm)   Pulse 69   Temp 98.4 F (36.9 C) (Oral)   Resp 20   Ht 6' (1.829 m)   Wt 220 lb (99.8 kg)   SpO2 99%   BMI 29.84 kg/m   Visual Acuity Right Eye Distance:   Left Eye Distance:   Bilateral Distance:    Right Eye Near:   Left Eye Near:    Bilateral Near:     Physical Exam Vitals and nursing note reviewed.  Constitutional:  Appearance: Normal appearance. He is not ill-appearing.  HENT:     Head: Normocephalic and atraumatic.     Right Ear: Tympanic membrane, ear canal and external ear normal. There is no impacted cerumen.     Left Ear: Tympanic membrane, ear canal and external ear normal. There is no impacted cerumen.     Nose: Congestion and rhinorrhea present.     Mouth/Throat:     Mouth: Mucous membranes are moist.     Pharynx: Oropharynx is clear. Posterior oropharyngeal erythema present.  Cardiovascular:     Rate and Rhythm: Normal rate and regular rhythm.     Pulses: Normal pulses.     Heart sounds: Normal heart sounds. No murmur heard.   No friction rub. No gallop.  Pulmonary:     Effort: Pulmonary effort is normal.     Breath sounds: Wheezing and rhonchi present. No rales.  Musculoskeletal:     Cervical back: Normal range of motion and neck supple.  Lymphadenopathy:     Cervical: No cervical adenopathy.  Skin:    General: Skin is warm and dry.     Capillary Refill: Capillary refill takes less than 2 seconds.     Findings: No erythema or rash.  Neurological:     General: No focal deficit present.     Mental Status: He is alert and oriented to person, place, and time.  Psychiatric:        Mood and Affect: Mood normal.        Behavior: Behavior normal.        Thought Content: Thought content normal.        Judgment: Judgment normal.     UC Treatments / Results  Labs (all labs ordered are listed, but only abnormal results are displayed) Labs Reviewed - No data  to display  EKG   Radiology No results found.  Procedures Procedures (including critical care time)  Medications Ordered in UC Medications - No data to display  Initial Impression / Assessment and Plan / UC Course  I have reviewed the triage vital signs and the nursing notes.  Pertinent labs & imaging results that were available during my care of the patient were reviewed by me and considered in my medical decision making (see chart for details).  Patient is a nontoxic-appearing 56 year old male here for weeks worth of respiratory symptoms as outlined in HPI above.  Patient is not in any acute distress at all and he has not coughed during the course of the interview.  His physical exam reveals pearly-gray tympanic membranes bilaterally with normal light reflex and clear external auditory canals.  Nasal mucosa is mildly erythematous and slightly erythematous with scant clear discharge in both nares.  Posterior oropharynx has mild erythema with scant clear postnasal drip.  No exudate or erythema noted.  No cervical lymphadenopathy appreciated exam.  Cardiopulmonary exam reveals wheezes and rhonchi in the upper and middle lobes bilaterally.  Patient's exam is consistent with an upper respiratory tract and bronchitis.  We will place patient on doxycycline to cover for potential bacterial sources as he does use CPAP.  We will also give Atrovent nasal rate of the nasal congestion, albuterol inhaler to help with the wheezing and shortness of breath, Tessalon Perles, and Promethazine DM cough syrup.  I have also prescribed prednisone to help with the upper airway inflammation and facial pressure.  Work note provided.   Final Clinical Impressions(s) / UC Diagnoses   Final diagnoses:  Upper respiratory tract infection, unspecified  type  Acute bronchitis, unspecified organism     Discharge Instructions      Use the albuterol inhaler/nebulizer every 4-6 hours as needed for shortness of breath,  wheezing, and cough.  Take the prednisone according to the package instructions to help with pulmonary inflammation.  Use the Tessalon Perles every 8 hours for your cough.  Taken with a small sip of water.  They may give you some numbness to the base of your tongue or metallic taste in her mouth, this is normal.  They are designed to calm down the cough reflex.  Use the Promethazine DM cough syrup at bedtime as will make you drowsy.  You may take 1 teaspoon (5 mL) every 6 hours.  Return for reevaluation for new or worsening symptoms.      ED Prescriptions     Medication Sig Dispense Auth. Provider   doxycycline (VIBRAMYCIN) 100 MG capsule Take 1 capsule (100 mg total) by mouth 2 (two) times daily. 20 capsule Becky Augusta, NP   benzonatate (TESSALON) 100 MG capsule Take 2 capsules (200 mg total) by mouth every 8 (eight) hours. 21 capsule Becky Augusta, NP   ipratropium (ATROVENT) 0.06 % nasal spray Place 2 sprays into both nostrils 4 (four) times daily. 15 mL Becky Augusta, NP   albuterol (VENTOLIN HFA) 108 (90 Base) MCG/ACT inhaler Inhale 2 puffs into the lungs every 4 (four) hours as needed. 18 g Becky Augusta, NP   Spacer/Aero-Holding Chambers (AEROCHAMBER MV) inhaler Use as instructed 1 each Becky Augusta, NP   predniSONE (DELTASONE) 20 MG tablet Take 3 tablets (60 mg total) by mouth daily with breakfast for 5 days. 3 tablets daily for 5 days. 15 tablet Becky Augusta, NP   promethazine-dextromethorphan (PROMETHAZINE-DM) 6.25-15 MG/5ML syrup Take 5 mLs by mouth 4 (four) times daily as needed. 118 mL Becky Augusta, NP      PDMP not reviewed this encounter.   Becky Augusta, NP 02/21/22 1549

## 2022-04-19 LAB — COLOGUARD: Cologuard: NEGATIVE

## 2022-05-17 ENCOUNTER — Other Ambulatory Visit: Payer: Self-pay

## 2022-05-17 ENCOUNTER — Ambulatory Visit
Admission: EM | Admit: 2022-05-17 | Discharge: 2022-05-17 | Disposition: A | Discharge status: Home / Self Care | Payer: BC Managed Care – PPO | Attending: Student | Admitting: Student

## 2022-05-17 ENCOUNTER — Encounter: Payer: Self-pay | Admitting: Emergency Medicine

## 2022-05-17 DIAGNOSIS — J208 Acute bronchitis due to other specified organisms: Secondary | ICD-10-CM

## 2022-05-17 MED ORDER — PREDNISONE 10 MG (21) PO TBPK: ORAL_TABLET | Freq: Every day | ORAL | 0 refills | Status: DC

## 2022-05-17 MED ORDER — AMOXICILLIN-POT CLAVULANATE 875-125 MG PO TABS: 1.0000 | ORAL_TABLET | Freq: Two times a day (BID) | ORAL | 0 refills | Status: DC

## 2022-05-17 MED ORDER — ALBUTEROL SULFATE HFA 108 (90 BASE) MCG/ACT IN AERS: 2.0000 | INHALATION_SPRAY | RESPIRATORY_TRACT | 0 refills | Status: DC | PRN

## 2022-05-17 NOTE — Discharge Instructions (Addendum)
-  Start the antibiotic-Augmentin (amoxicillin-clavulanate), 1 pill every 12 hours for 7 days.  You can take this with food like with breakfast and dinner. -Prednisone taper for cough/bronchitis. I recommend taking this in the morning as it could give you energy.  Avoid NSAIDs like ibuprofen and alleve while taking this medication as they can increase your risk of stomach upset and even GI bleeding when in combination with a steroid. You can continue tylenol (acetaminophen) up to 1000mg  3x daily. -Albuterol inhaler as needed for cough, wheezing, shortness of breath, 1 to 2 puffs every 6 hours as needed. -You can purchase Imodium (loperamide) over-the-counter. Take the Imodium (loperamide) up to 4 times daily for diarrhea. You can also try pepto bismal or Tums or similar.  -With a virus, you're typically contagious for 5-7 days, or as long as you're having fevers.  -Follow-up if symptoms worsen instead of improve

## 2022-05-17 NOTE — ED Provider Notes (Signed)
[facility]    CSN: [id number] Arrival date & time: 05/17/22  1534      History   Chief Complaint Chief Complaint  Patient presents with   URI    HPI Eugene [name] is a 56 y.o. male presenting with viral syndrome for 4 days.  History OSA on CPAP, former smoker.  He describes symptoms that started with crampy abdominal pain, decreased appetite, diarrhea.  This progressed into cough that is productive of dark yellow sputum and nasal congestion.  Denies dyspnea, chest pain, known fevers.  Denies nausea, vomiting.  Tolerating fluids and food despite decreased appetite.  HPI  Past Medical History:  Diagnosis Date   Arthritis    Hypertension    Sleep apnea    Thyroid disease     Patient Active Problem List   Diagnosis Date Noted   Abnormal gait 01/25/2022   Knee pain 01/25/2022   Knee stiff 01/25/2022   Rupture of quadriceps tendon 01/25/2022   OSA (obstructive sleep apnea) 07/10/2018   Rupture of tendon of biceps, long head 06/12/2018   Shoulder strain 06/12/2018    Past Surgical History:  Procedure Laterality Date   knee sugery Right    left elbow surgery     SHOULDER ARTHROSCOPY         Home Medications    Prior to Admission medications   Medication Sig Start Date End Date Taking? Authorizing Provider  amoxicillin-clavulanate (AUGMENTIN) 875-125 MG tablet Take 1 tablet by mouth every 12 (twelve) hours. 05/17/22  Yes Rhys Martini, PA-C  predniSONE (STERAPRED UNI-PAK 21 TAB) 10 MG (21) TBPK tablet Take by mouth daily. Take 6 tabs by mouth daily  for 2 days, then 5 tabs for 2 days, then 4 tabs for 2 days, then 3 tabs for 2 days, 2 tabs for 2 days, then 1 tab by mouth daily for 2 days 05/17/22  Yes Cheree Ditto, Lyman Speller, PA-C  albuterol (VENTOLIN HFA) 108 (90 Base) MCG/ACT inhaler Inhale 2 puffs into the lungs every 4 (four) hours as needed. 05/17/22   Rhys Martini, PA-C  allopurinol (ZYLOPRIM) 100 MG tablet Take 100 mg by mouth daily.    [provider]  atorvastatin (LIPITOR) 10 MG tablet atorvastatin 10 mg tablet    [provider]  atorvastatin (LIPITOR) 80 MG tablet atorvastatin 80 mg tablet    [provider]  benzonatate (TESSALON) 100 MG capsule Take 2 capsules (200 mg total) by mouth every 8 (eight) hours. Patient not taking: Reported on 05/17/2022 02/21/22   Becky Augusta, NP  carvedilol (COREG) 12.5 MG tablet Take 12.5 mg by mouth 2 (two) times daily with a meal.    [provider]  ergocalciferol (VITAMIN D2) 1.25 MG (50000 UT) capsule ergocalciferol (vitamin D2) 1,250 mcg (50,000 unit) capsule  TAKE 1 CAPSULE BY MOUTH WEEKLY    [provider]  finasteride (PROPECIA) 1 MG tablet Take 1 tablet by mouth daily.    [provider]  fluticasone (FLONASE) 50 MCG/ACT nasal spray fluticasone propionate 50 mcg/actuation nasal spray,suspension    [provider]  furosemide (LASIX) 20 MG tablet furosemide 20 mg tablet    [provider]  gemfibrozil (LOPID) 600 MG tablet gemfibrozil 600 mg tablet    [provider]  hydrocortisone 2.5 % cream hydrocortisone 2.5 % topical cream    [provider]  ipratropium (ATROVENT) 0.06 % nasal spray Place 2 sprays into both nostrils 4 (four) times daily. 02/21/22   Alycia Rossetti,  Riki Rusk, NP  ketoconazole (NIZORAL) 2 % shampoo ketoconazole 2 % shampoo    [provider]  levothyroxine (SYNTHROID, LEVOTHROID) 25 MCG tablet Take 25 mcg by mouth daily before breakfast.    [provider]  lisinopril-hydrochlorothiazide (ZESTORETIC) 20-25 MG tablet lisinopril 20 mg-hydrochlorothiazide 25 mg tablet  TAKE 1 TABLET BY MOUTH EVERY DAY    [provider]  METHYLPREDNISOLONE PO methylprednisolone 4 mg tablets in a dose pack    [provider]  minoxidil (LONITEN) 2.5 MG tablet Take 2.5 mg by mouth daily. 01/20/22   [provider]  mometasone (NASONEX) 50 MCG/ACT nasal spray mometasone 50  mcg/actuation nasal spray    [provider]  Multiple Vitamin (MULTIVITAMIN) capsule Take 1 capsule by mouth daily.    [provider]  naproxen (EC NAPROSYN) 500 MG EC tablet EC-Naproxen 500 mg tablet,delayed release  TAKE 1 TABLET BY MOUTH TWICE A DAY    [provider]  olopatadine (PATANOL) 0.1 % ophthalmic solution olopatadine 0.1 % eye drops    [provider]  Omega-3 Fatty Acids (FISH-EPA PO) Take by mouth.    [provider]  promethazine-dextromethorphan (PROMETHAZINE-DM) 6.25-15 MG/5ML syrup Take 5 mLs by mouth 4 (four) times daily as needed. Patient not taking: Reported on 05/17/2022 02/21/22   Becky Augusta, NP  Spacer/Aero-Holding Chambers (AEROCHAMBER MV) inhaler Use as instructed 02/21/22   Becky Augusta, NP  testosterone enanthate (DELATESTRYL) 200 MG/ML injection Inject into the muscle.    [provider]    Family History Family History  Problem Relation Age of Onset   Hypertension Father     Social History Social History   Tobacco Use   Smoking status: Former   Smokeless tobacco: Never  Building services engineer Use: Never used  Substance Use Topics   Alcohol use: Yes    Comment: beer 1-2 a week   Drug use: No     Allergies   Other and Cat hair extract   Review of Systems Review of Systems  Constitutional:  Negative for appetite change, chills and fever.  HENT:  Positive for congestion. Negative for ear pain, rhinorrhea, sinus pressure, sinus pain and sore throat.   Eyes:  Negative for redness and visual disturbance.  Respiratory:  Positive for cough. Negative for chest tightness, shortness of breath and wheezing.   Cardiovascular:  Negative for chest pain and palpitations.  Gastrointestinal:  Negative for abdominal pain, constipation, diarrhea, nausea and vomiting.  Genitourinary:  Negative for dysuria, frequency and urgency.  Musculoskeletal:  Negative for myalgias.  Neurological:  Negative for dizziness,  weakness and headaches.  Psychiatric/Behavioral:  Negative for confusion.   All other systems reviewed and are negative.    Physical Exam Triage Vital Signs ED Triage Vitals  Enc Vitals Group     BP 05/17/22 1552 (!) 143/80     Pulse Rate 05/17/22 1552 67     Resp 05/17/22 1552 18     Temp 05/17/22 1552 99.3 F (37.4 C)     Temp Source 05/17/22 1552 Oral     SpO2 05/17/22 1552 98 %     Weight --      Height --      Head Circumference --      Peak Flow --      Pain Score 05/17/22 1549 4     Pain Loc --      Pain Edu? --      Excl. in GC? --    No  data found.  Updated Vital Signs BP (!) 143/80 (BP Location: Left Arm)   Pulse 67   Temp 99.3 F (37.4 C) (Oral)   Resp 18   SpO2 98%   Visual Acuity Right Eye Distance:   Left Eye Distance:   Bilateral Distance:    Right Eye Near:   Left Eye Near:    Bilateral Near:     Physical Exam Vitals reviewed.  Constitutional:      General: He is not in acute distress.    Appearance: Normal appearance. He is not ill-appearing.  HENT:     Head: Normocephalic and atraumatic.     Right Ear: Tympanic membrane, ear canal and external ear normal. No tenderness. No middle ear effusion. There is no impacted cerumen. Tympanic membrane is not perforated, erythematous, retracted or bulging.     Left Ear: Tympanic membrane, ear canal and external ear normal. No tenderness.  No middle ear effusion. There is no impacted cerumen. Tympanic membrane is not perforated, erythematous, retracted or bulging.     Nose: Nose normal. No congestion.     Mouth/Throat:     Mouth: Mucous membranes are moist.     Pharynx: Uvula midline. No oropharyngeal exudate or posterior oropharyngeal erythema.  Eyes:     Extraocular Movements: Extraocular movements intact.     Pupils: Pupils are equal, round, and reactive to light.  Cardiovascular:     Rate and Rhythm: Normal rate and regular rhythm.     Heart sounds: Normal heart sounds.  Pulmonary:     Effort:  Pulmonary effort is normal.     Breath sounds: Rhonchi present. No decreased breath sounds, wheezing or rales.     Comments: Rhonchi throughout  Oxygenating comfortably  Abdominal:     Palpations: Abdomen is soft.     Tenderness: There is no abdominal tenderness. There is no guarding or rebound.  Lymphadenopathy:     Cervical: No cervical adenopathy.     Right cervical: No superficial cervical adenopathy.    Left cervical: No superficial cervical adenopathy.  Neurological:     General: No focal deficit present.     Mental Status: He is alert and oriented to person, place, and time.  Psychiatric:        Mood and Affect: Mood normal.        Behavior: Behavior normal.        Thought Content: Thought content normal.        Judgment: Judgment normal.      UC Treatments / Results  Labs (all labs ordered are listed, but only abnormal results are displayed) Labs Reviewed - No data to display  EKG   Radiology No results found.  Procedures Procedures (including critical care time)  Medications Ordered in UC Medications - No data to display  Initial Impression / Assessment and Plan / UC Course  I have reviewed the triage vital signs and the nursing notes.  Pertinent labs & imaging results that were available during my care of the patient were reviewed by me and considered in my medical decision making (see chart for details).     This patient is a very pleasant 56 y.o. year old male presenting with acute bronchitis. Afebrile, nontachy, oxygenating well on room air. He is a former smoker. Given cough increasingly productive of purulent sputum, with rhonchi, and history smoking, will proceed with augmentin. Also sent albuterol inhaler and prednisone taper.  Did not check a chest x-ray as this would not change management.  He is in agreement.  Work note provided. ED return precautions discussed. Patient verbalizes understanding and agreement.    Final Clinical Impressions(s) / UC  Diagnoses   Final diagnoses:  Viral bronchitis     Discharge Instructions      -Start the antibiotic-Augmentin (amoxicillin-clavulanate), 1 pill every 12 hours for 7 days.  You can take this with food like with breakfast and dinner. -Prednisone taper for cough/bronchitis. I recommend taking this in the morning as it could give you energy.  Avoid NSAIDs like ibuprofen and alleve while taking this medication as they can increase your risk of stomach upset and even GI bleeding when in combination with a steroid. You can continue tylenol (acetaminophen) up to  3x daily. -Albuterol inhaler as needed for cough, wheezing, shortness of breath, 1 to 2 puffs every 6 hours as needed. -You can purchase Imodium (loperamide) over-the-counter. Take the Imodium (loperamide) up to 4 times daily for diarrhea. You can also try pepto bismal or Tums or similar.  -With a virus, you're typically contagious for 5-7 days, or as long as you're having fevers.  -Follow-up if symptoms worsen instead of improve      ED Prescriptions     Medication Sig Dispense Auth. Provider   albuterol (VENTOLIN HFA) 108 (90 Base) MCG/ACT inhaler Inhale 2 puffs into the lungs every 4 (four) hours as needed. 18 g Ignacia Bayley E, PA-C   amoxicillin-clavulanate (AUGMENTIN) 875-125 MG tablet Take 1 tablet by mouth every 12 (twelve) hours. 14 tablet Rhys Martini, PA-C   predniSONE (STERAPRED UNI-PAK 21 TAB) 10 MG (21) TBPK tablet Take by mouth daily. Take 6 tabs by mouth daily  for 2 days, then 5 tabs for 2 days, then 4 tabs for 2 days, then 3 tabs for 2 days, 2 tabs for 2 days, then 1 tab by mouth daily for 2 days 42 tablet Rhys Martini, PA-C      PDMP not reviewed this encounter.   Rhys Martini, PA-C 05/17/22 1616

## 2022-05-17 NOTE — ED Triage Notes (Signed)
Friday started having abdominal pain, crampy abdominal pain, no vomiting.  Patient had congestion, poor appetite and chills as well on Saturday.  Today coughing and continued congestion.    In march had similar symptoms treated with prednisone and made a big difference

## 2022-08-11 DIAGNOSIS — E7800 Pure hypercholesterolemia, unspecified: Secondary | ICD-10-CM | POA: Diagnosis not present

## 2022-08-11 DIAGNOSIS — I10 Essential (primary) hypertension: Secondary | ICD-10-CM | POA: Diagnosis not present

## 2022-08-11 DIAGNOSIS — Z6833 Body mass index (BMI) 33.0-33.9, adult: Secondary | ICD-10-CM | POA: Diagnosis not present

## 2022-08-11 DIAGNOSIS — E291 Testicular hypofunction: Secondary | ICD-10-CM | POA: Diagnosis not present

## 2022-08-11 DIAGNOSIS — E040 Nontoxic diffuse goiter: Secondary | ICD-10-CM | POA: Diagnosis not present

## 2022-08-11 DIAGNOSIS — E6609 Other obesity due to excess calories: Secondary | ICD-10-CM | POA: Diagnosis not present

## 2022-08-31 DIAGNOSIS — E039 Hypothyroidism, unspecified: Secondary | ICD-10-CM | POA: Diagnosis not present

## 2022-08-31 DIAGNOSIS — E291 Testicular hypofunction: Secondary | ICD-10-CM | POA: Diagnosis not present

## 2022-08-31 DIAGNOSIS — I10 Essential (primary) hypertension: Secondary | ICD-10-CM | POA: Diagnosis not present

## 2022-08-31 DIAGNOSIS — E7800 Pure hypercholesterolemia, unspecified: Secondary | ICD-10-CM | POA: Diagnosis not present

## 2022-08-31 DIAGNOSIS — Z6834 Body mass index (BMI) 34.0-34.9, adult: Secondary | ICD-10-CM | POA: Diagnosis not present

## 2022-09-09 DIAGNOSIS — M5137 Other intervertebral disc degeneration, lumbosacral region: Secondary | ICD-10-CM | POA: Diagnosis not present

## 2022-09-09 DIAGNOSIS — M9903 Segmental and somatic dysfunction of lumbar region: Secondary | ICD-10-CM | POA: Diagnosis not present

## 2022-09-09 DIAGNOSIS — M461 Sacroiliitis, not elsewhere classified: Secondary | ICD-10-CM | POA: Diagnosis not present

## 2022-09-09 DIAGNOSIS — M9905 Segmental and somatic dysfunction of pelvic region: Secondary | ICD-10-CM | POA: Diagnosis not present

## 2022-09-22 DIAGNOSIS — Z6834 Body mass index (BMI) 34.0-34.9, adult: Secondary | ICD-10-CM | POA: Diagnosis not present

## 2022-09-22 DIAGNOSIS — E7800 Pure hypercholesterolemia, unspecified: Secondary | ICD-10-CM | POA: Diagnosis not present

## 2022-09-22 DIAGNOSIS — E6609 Other obesity due to excess calories: Secondary | ICD-10-CM | POA: Diagnosis not present

## 2022-09-22 DIAGNOSIS — Z23 Encounter for immunization: Secondary | ICD-10-CM | POA: Diagnosis not present

## 2022-09-22 DIAGNOSIS — E039 Hypothyroidism, unspecified: Secondary | ICD-10-CM | POA: Diagnosis not present

## 2022-09-22 DIAGNOSIS — E291 Testicular hypofunction: Secondary | ICD-10-CM | POA: Diagnosis not present

## 2022-09-22 DIAGNOSIS — I10 Essential (primary) hypertension: Secondary | ICD-10-CM | POA: Diagnosis not present

## 2022-09-22 DIAGNOSIS — F5221 Male erectile disorder: Secondary | ICD-10-CM | POA: Diagnosis not present

## 2022-10-06 DIAGNOSIS — E7800 Pure hypercholesterolemia, unspecified: Secondary | ICD-10-CM | POA: Diagnosis not present

## 2022-10-06 DIAGNOSIS — E6609 Other obesity due to excess calories: Secondary | ICD-10-CM | POA: Diagnosis not present

## 2022-10-06 DIAGNOSIS — I10 Essential (primary) hypertension: Secondary | ICD-10-CM | POA: Diagnosis not present

## 2022-10-06 DIAGNOSIS — Z6834 Body mass index (BMI) 34.0-34.9, adult: Secondary | ICD-10-CM | POA: Diagnosis not present

## 2022-10-06 DIAGNOSIS — E039 Hypothyroidism, unspecified: Secondary | ICD-10-CM | POA: Diagnosis not present

## 2022-10-06 DIAGNOSIS — E291 Testicular hypofunction: Secondary | ICD-10-CM | POA: Diagnosis not present

## 2022-10-12 DIAGNOSIS — M461 Sacroiliitis, not elsewhere classified: Secondary | ICD-10-CM | POA: Diagnosis not present

## 2022-10-12 DIAGNOSIS — M9905 Segmental and somatic dysfunction of pelvic region: Secondary | ICD-10-CM | POA: Diagnosis not present

## 2022-10-12 DIAGNOSIS — M9903 Segmental and somatic dysfunction of lumbar region: Secondary | ICD-10-CM | POA: Diagnosis not present

## 2022-10-12 DIAGNOSIS — M5137 Other intervertebral disc degeneration, lumbosacral region: Secondary | ICD-10-CM | POA: Diagnosis not present

## 2022-10-12 DIAGNOSIS — I10 Essential (primary) hypertension: Secondary | ICD-10-CM | POA: Diagnosis not present

## 2022-10-12 DIAGNOSIS — E7800 Pure hypercholesterolemia, unspecified: Secondary | ICD-10-CM | POA: Diagnosis not present

## 2022-10-12 DIAGNOSIS — E6609 Other obesity due to excess calories: Secondary | ICD-10-CM | POA: Diagnosis not present

## 2022-10-12 DIAGNOSIS — Z6834 Body mass index (BMI) 34.0-34.9, adult: Secondary | ICD-10-CM | POA: Diagnosis not present

## 2022-10-12 DIAGNOSIS — E291 Testicular hypofunction: Secondary | ICD-10-CM | POA: Diagnosis not present

## 2022-10-22 DIAGNOSIS — M25561 Pain in right knee: Secondary | ICD-10-CM | POA: Diagnosis not present

## 2022-10-22 DIAGNOSIS — M25562 Pain in left knee: Secondary | ICD-10-CM | POA: Diagnosis not present

## 2022-11-03 DIAGNOSIS — Z6834 Body mass index (BMI) 34.0-34.9, adult: Secondary | ICD-10-CM | POA: Diagnosis not present

## 2022-11-03 DIAGNOSIS — E039 Hypothyroidism, unspecified: Secondary | ICD-10-CM | POA: Diagnosis not present

## 2022-11-03 DIAGNOSIS — E6609 Other obesity due to excess calories: Secondary | ICD-10-CM | POA: Diagnosis not present

## 2022-11-03 DIAGNOSIS — E7800 Pure hypercholesterolemia, unspecified: Secondary | ICD-10-CM | POA: Diagnosis not present

## 2022-11-03 DIAGNOSIS — E291 Testicular hypofunction: Secondary | ICD-10-CM | POA: Diagnosis not present

## 2022-11-10 DIAGNOSIS — M5137 Other intervertebral disc degeneration, lumbosacral region: Secondary | ICD-10-CM | POA: Diagnosis not present

## 2022-11-10 DIAGNOSIS — M9903 Segmental and somatic dysfunction of lumbar region: Secondary | ICD-10-CM | POA: Diagnosis not present

## 2022-11-10 DIAGNOSIS — M461 Sacroiliitis, not elsewhere classified: Secondary | ICD-10-CM | POA: Diagnosis not present

## 2022-11-10 DIAGNOSIS — M9905 Segmental and somatic dysfunction of pelvic region: Secondary | ICD-10-CM | POA: Diagnosis not present

## 2022-11-12 DIAGNOSIS — M25562 Pain in left knee: Secondary | ICD-10-CM | POA: Diagnosis not present

## 2022-11-23 DIAGNOSIS — E7800 Pure hypercholesterolemia, unspecified: Secondary | ICD-10-CM | POA: Diagnosis not present

## 2022-11-23 DIAGNOSIS — E039 Hypothyroidism, unspecified: Secondary | ICD-10-CM | POA: Diagnosis not present

## 2022-11-23 DIAGNOSIS — Z6834 Body mass index (BMI) 34.0-34.9, adult: Secondary | ICD-10-CM | POA: Diagnosis not present

## 2022-11-23 DIAGNOSIS — K219 Gastro-esophageal reflux disease without esophagitis: Secondary | ICD-10-CM | POA: Diagnosis not present

## 2022-11-23 DIAGNOSIS — E6609 Other obesity due to excess calories: Secondary | ICD-10-CM | POA: Diagnosis not present

## 2022-11-23 DIAGNOSIS — E291 Testicular hypofunction: Secondary | ICD-10-CM | POA: Diagnosis not present

## 2022-11-23 DIAGNOSIS — I10 Essential (primary) hypertension: Secondary | ICD-10-CM | POA: Diagnosis not present

## 2022-11-23 DIAGNOSIS — E559 Vitamin D deficiency, unspecified: Secondary | ICD-10-CM | POA: Diagnosis not present

## 2022-12-14 ENCOUNTER — Ambulatory Visit: Payer: BC Managed Care – PPO | Admitting: Internal Medicine

## 2022-12-14 DIAGNOSIS — M25562 Pain in left knee: Secondary | ICD-10-CM | POA: Diagnosis not present

## 2022-12-15 DIAGNOSIS — E291 Testicular hypofunction: Secondary | ICD-10-CM | POA: Diagnosis not present

## 2022-12-15 DIAGNOSIS — E6609 Other obesity due to excess calories: Secondary | ICD-10-CM | POA: Diagnosis not present

## 2022-12-15 DIAGNOSIS — E7800 Pure hypercholesterolemia, unspecified: Secondary | ICD-10-CM | POA: Diagnosis not present

## 2022-12-15 DIAGNOSIS — Z6833 Body mass index (BMI) 33.0-33.9, adult: Secondary | ICD-10-CM | POA: Diagnosis not present

## 2022-12-15 DIAGNOSIS — E039 Hypothyroidism, unspecified: Secondary | ICD-10-CM | POA: Diagnosis not present

## 2022-12-15 DIAGNOSIS — I10 Essential (primary) hypertension: Secondary | ICD-10-CM | POA: Diagnosis not present

## 2022-12-16 ENCOUNTER — Encounter: Payer: Self-pay | Admitting: Internal Medicine

## 2022-12-16 ENCOUNTER — Ambulatory Visit: Payer: BC Managed Care – PPO | Admitting: Internal Medicine

## 2022-12-16 VITALS — BP 149/88 | HR 57 | Temp 98.4°F | Resp 16 | Ht 72.0 in | Wt 232.4 lb

## 2022-12-16 DIAGNOSIS — Z7189 Other specified counseling: Secondary | ICD-10-CM

## 2022-12-16 DIAGNOSIS — G4733 Obstructive sleep apnea (adult) (pediatric): Secondary | ICD-10-CM

## 2022-12-16 DIAGNOSIS — M5137 Other intervertebral disc degeneration, lumbosacral region: Secondary | ICD-10-CM | POA: Diagnosis not present

## 2022-12-16 DIAGNOSIS — M461 Sacroiliitis, not elsewhere classified: Secondary | ICD-10-CM | POA: Diagnosis not present

## 2022-12-16 DIAGNOSIS — M9905 Segmental and somatic dysfunction of pelvic region: Secondary | ICD-10-CM | POA: Diagnosis not present

## 2022-12-16 DIAGNOSIS — M9903 Segmental and somatic dysfunction of lumbar region: Secondary | ICD-10-CM | POA: Diagnosis not present

## 2022-12-16 NOTE — Progress Notes (Signed)
[facility] [place], [place] [address]  Pulmonary Sleep Medicine   Office Visit Note  Patient Name: Eugene [name] DOB: [birth year] MRN [id number]  Date of Service: 12/16/2022  Complaints/HPI: He is doing well. On CPAP and has good response. He has been using it nightly. Patient states no side effects. No nasal infections. States that he has had some cough not related to the PAP. His BMI is up. He states he has not been exercisisng back on regimen. Has no other new hospitilizations  ROS  General: (-) fever, (-) chills, (-) night sweats, (-) weakness Skin: (-) rashes, (-) itching,. Eyes: (-) visual changes, (-) redness, (-) itching. Nose and Sinuses: (-) nasal stuffiness or itchiness, (-) postnasal drip, (-) nosebleeds, (-) sinus trouble. Mouth and Throat: (-) sore throat, (-) hoarseness. Neck: (-) swollen glands, (-) enlarged thyroid, (-) neck pain. Respiratory: - cough, (-) bloody sputum, - shortness of breath, - wheezing. Cardiovascular: - ankle swelling, (-) chest pain. Lymphatic: (-) lymph node enlargement. Neurologic: (-) numbness, (-) tingling. Psychiatric: (-) anxiety, (-) depression   Current Medication: Outpatient Encounter Medications as of 12/16/2022  Medication Sig   albuterol (VENTOLIN HFA) 108 (90 Base) MCG/ACT inhaler Inhale 2 puffs into the lungs every 4 (four) hours as needed.   allopurinol (ZYLOPRIM) 100 MG tablet Take 100 mg by mouth daily.   amoxicillin-clavulanate (AUGMENTIN) 875-125 MG tablet Take 1 tablet by mouth every 12 (twelve) hours.   atorvastatin (LIPITOR) 10 MG tablet atorvastatin 10 mg tablet   atorvastatin (LIPITOR) 80 MG tablet atorvastatin 80 mg tablet   benzonatate (TESSALON) 100 MG capsule Take 2 capsules (200 mg total) by mouth every 8 (eight) hours.   carvedilol (COREG) 12.5 MG tablet Take 12.5 mg by mouth 2 (two) times daily with a meal.   ergocalciferol (VITAMIN D2) 1.25 MG (50000 UT) capsule ergocalciferol  (vitamin D2) 1,250 mcg (50,000 unit) capsule  TAKE 1 CAPSULE BY MOUTH WEEKLY   finasteride (PROPECIA) 1 MG tablet Take 1 tablet by mouth daily.   fluticasone (FLONASE) 50 MCG/ACT nasal spray fluticasone propionate 50 mcg/actuation nasal spray,suspension   furosemide (LASIX) 20 MG tablet furosemide 20 mg tablet   gemfibrozil (LOPID) 600 MG tablet gemfibrozil 600 mg tablet   hydrocortisone 2.5 % cream hydrocortisone 2.5 % topical cream   ipratropium (ATROVENT) 0.06 % nasal spray Place 2 sprays into both nostrils 4 (four) times daily.   ketoconazole (NIZORAL) 2 % shampoo ketoconazole 2 % shampoo   levothyroxine (SYNTHROID, LEVOTHROID) 25 MCG tablet Take 25 mcg by mouth daily before breakfast.   lisinopril-hydrochlorothiazide (ZESTORETIC) 20-25 MG tablet lisinopril 20 mg-hydrochlorothiazide 25 mg tablet  TAKE 1 TABLET BY MOUTH EVERY DAY   METHYLPREDNISOLONE PO methylprednisolone 4 mg tablets in a dose pack   minoxidil (LONITEN) 2.5 MG tablet Take 2.5 mg by mouth daily.   mometasone (NASONEX) 50 MCG/ACT nasal spray mometasone 50 mcg/actuation nasal spray   Multiple Vitamin (MULTIVITAMIN) capsule Take 1 capsule by mouth daily.   naproxen (EC NAPROSYN) 500 MG EC tablet EC-Naproxen 500 mg tablet,delayed release  TAKE 1 TABLET BY MOUTH TWICE A DAY   olopatadine (PATANOL) 0.1 % ophthalmic solution olopatadine 0.1 % eye drops   Omega-3 Fatty Acids (FISH-EPA PO) Take by mouth.   predniSONE (STERAPRED UNI-PAK 21 TAB) 10 MG (21) TBPK tablet Take by mouth daily. Take 6 tabs by mouth daily  for 2 days, then 5 tabs for 2 days, then 4 tabs for 2 days, then 3 tabs for 2  days, 2 tabs for 2 days, then 1 tab by mouth daily for 2 days   promethazine-dextromethorphan (PROMETHAZINE-DM) 6.25-15 MG/5ML syrup Take 5 mLs by mouth 4 (four) times daily as needed.   Spacer/Aero-Holding Chambers (AEROCHAMBER MV) inhaler Use as instructed   testosterone enanthate (DELATESTRYL) 200 MG/ML injection Inject into the muscle.   No  facility-administered encounter medications on file as of 12/16/2022.    Surgical History: Past Surgical History:  Procedure Laterality Date   knee sugery Right    left elbow surgery     SHOULDER ARTHROSCOPY      Medical History: Past Medical History:  Diagnosis Date   Arthritis    Hypertension    Sleep apnea    Thyroid disease     Family History: Family History  Problem Relation Age of Onset   Hypertension Father     Social History: Social History   Socioeconomic History   Marital status: Married    Spouse name: Not on file   Number of children: Not on file   Years of education: Not on file   Highest education level: Not on file  Occupational History   Not on file  Tobacco Use   Smoking status: Former   Smokeless tobacco: Never  Vaping Use   Vaping Use: Never used  Substance and Sexual Activity   Alcohol use: Yes    Comment: beer 1-2 a week   Drug use: No   Sexual activity: Not on file  Other Topics Concern   Not on file  Social History Narrative   Not on file   Social Determinants of Health   Financial Resource Strain: Not on file  Food Insecurity: Not on file  Transportation Needs: Not on file  Physical Activity: Not on file  Stress: Not on file  Social Connections: Not on file  Intimate Partner Violence: Not on file    Vital Signs: Blood pressure (!) 149/88, pulse (!) 57, temperature 98.4 F (36.9 C), resp. rate 16, height 6' (1.829 m), weight 232 lb 6.4 oz (105.4 kg), SpO2 99 %.  Examination: General Appearance: The patient is well-developed, well-nourished, and in no distress. Skin: Gross inspection of skin unremarkable. Head: normocephalic, no gross deformities. Eyes: no gross deformities noted. ENT: ears appear grossly normal no exudates. Neck: Supple. No thyromegaly. No LAD. Respiratory: no rhonchi noted. Cardiovascular: Normal S1 and S2 without murmur or rub. Extremities: No cyanosis. pulses are equal. Neurologic: Alert and  oriented. No involuntary movements.  LABS: No results found for this or any previous visit (from the past 2160 hour(s)).  Radiology: No results found.  No results found.  No results found.    Assessment and Plan: Patient Active Problem List   Diagnosis Date Noted   Abnormal gait 01/25/2022   Knee pain 01/25/2022   Knee stiff 01/25/2022   Rupture of quadriceps tendon 01/25/2022   OSA (obstructive sleep apnea) 07/10/2018   Rupture of tendon of biceps, long head 06/12/2018   Shoulder strain 06/12/2018    1. Obstructive sleep apnea Continue with PAP therapy at the recommended levels.  We will continue to follow along closely.  2. CPAP use counseling CPAP Counseling: had a lengthy discussion with the patient regarding the importance of PAP therapy in management of the sleep apnea. Patient appears to understand the risk factor reduction and also understands the risks associated with untreated sleep apnea.   3. Obesity, morbid ([place]) Patient has gained some weight he understands that he needs to work on losing weight.  Spoke about diet exercise regimen and also avoidance of empty calories  General Counseling: I have discussed the findings of the evaluation and examination with Eugene [name].  I have also discussed any further diagnostic evaluation thatmay be needed or ordered today. Eugene [name] verbalizes understanding of the findings of todays visit. We also reviewed his medications today and discussed drug interactions and side effects including but not limited excessive drowsiness and altered mental states. We also discussed that there is always a risk not just to him but also people around him. he has been encouraged to call the office with any questions or concerns that should arise related to todays visit.  No orders of the defined types were placed in this encounter.    Time spent: 45  I have personally obtained a history, examined the patient, evaluated laboratory and imaging results,  formulated the assessment and plan and placed orders.    Yevonne Pax, MD [facility] Pulmonary and Critical Care Sleep medicine

## 2022-12-23 DIAGNOSIS — M25562 Pain in left knee: Secondary | ICD-10-CM | POA: Diagnosis not present

## 2022-12-30 DIAGNOSIS — M25562 Pain in left knee: Secondary | ICD-10-CM | POA: Diagnosis not present

## 2023-01-05 DIAGNOSIS — E6609 Other obesity due to excess calories: Secondary | ICD-10-CM | POA: Diagnosis not present

## 2023-01-05 DIAGNOSIS — I10 Essential (primary) hypertension: Secondary | ICD-10-CM | POA: Diagnosis not present

## 2023-01-05 DIAGNOSIS — E7800 Pure hypercholesterolemia, unspecified: Secondary | ICD-10-CM | POA: Diagnosis not present

## 2023-01-05 DIAGNOSIS — Z6834 Body mass index (BMI) 34.0-34.9, adult: Secondary | ICD-10-CM | POA: Diagnosis not present

## 2023-01-05 DIAGNOSIS — E039 Hypothyroidism, unspecified: Secondary | ICD-10-CM | POA: Diagnosis not present

## 2023-01-05 DIAGNOSIS — E559 Vitamin D deficiency, unspecified: Secondary | ICD-10-CM | POA: Diagnosis not present

## 2023-01-05 DIAGNOSIS — E291 Testicular hypofunction: Secondary | ICD-10-CM | POA: Diagnosis not present

## 2023-01-06 DIAGNOSIS — M25562 Pain in left knee: Secondary | ICD-10-CM | POA: Diagnosis not present

## 2023-01-13 DIAGNOSIS — M5137 Other intervertebral disc degeneration, lumbosacral region: Secondary | ICD-10-CM | POA: Diagnosis not present

## 2023-01-13 DIAGNOSIS — M461 Sacroiliitis, not elsewhere classified: Secondary | ICD-10-CM | POA: Diagnosis not present

## 2023-01-13 DIAGNOSIS — M9903 Segmental and somatic dysfunction of lumbar region: Secondary | ICD-10-CM | POA: Diagnosis not present

## 2023-01-13 DIAGNOSIS — M9905 Segmental and somatic dysfunction of pelvic region: Secondary | ICD-10-CM | POA: Diagnosis not present

## 2023-01-26 DIAGNOSIS — E039 Hypothyroidism, unspecified: Secondary | ICD-10-CM | POA: Diagnosis not present

## 2023-01-26 DIAGNOSIS — Z125 Encounter for screening for malignant neoplasm of prostate: Secondary | ICD-10-CM | POA: Diagnosis not present

## 2023-01-26 DIAGNOSIS — E559 Vitamin D deficiency, unspecified: Secondary | ICD-10-CM | POA: Diagnosis not present

## 2023-01-26 DIAGNOSIS — M1000 Idiopathic gout, unspecified site: Secondary | ICD-10-CM | POA: Diagnosis not present

## 2023-01-26 DIAGNOSIS — E291 Testicular hypofunction: Secondary | ICD-10-CM | POA: Diagnosis not present

## 2023-01-26 DIAGNOSIS — Z6832 Body mass index (BMI) 32.0-32.9, adult: Secondary | ICD-10-CM | POA: Diagnosis not present

## 2023-01-26 DIAGNOSIS — I10 Essential (primary) hypertension: Secondary | ICD-10-CM | POA: Diagnosis not present

## 2023-01-26 DIAGNOSIS — G4733 Obstructive sleep apnea (adult) (pediatric): Secondary | ICD-10-CM | POA: Diagnosis not present

## 2023-01-26 DIAGNOSIS — E7800 Pure hypercholesterolemia, unspecified: Secondary | ICD-10-CM | POA: Diagnosis not present

## 2023-01-26 DIAGNOSIS — E6609 Other obesity due to excess calories: Secondary | ICD-10-CM | POA: Diagnosis not present

## 2023-02-09 DIAGNOSIS — M461 Sacroiliitis, not elsewhere classified: Secondary | ICD-10-CM | POA: Diagnosis not present

## 2023-02-09 DIAGNOSIS — M9903 Segmental and somatic dysfunction of lumbar region: Secondary | ICD-10-CM | POA: Diagnosis not present

## 2023-02-09 DIAGNOSIS — M9905 Segmental and somatic dysfunction of pelvic region: Secondary | ICD-10-CM | POA: Diagnosis not present

## 2023-02-09 DIAGNOSIS — M5137 Other intervertebral disc degeneration, lumbosacral region: Secondary | ICD-10-CM | POA: Diagnosis not present

## 2023-02-10 DIAGNOSIS — E291 Testicular hypofunction: Secondary | ICD-10-CM | POA: Diagnosis not present

## 2023-02-10 DIAGNOSIS — E559 Vitamin D deficiency, unspecified: Secondary | ICD-10-CM | POA: Diagnosis not present

## 2023-02-10 DIAGNOSIS — I10 Essential (primary) hypertension: Secondary | ICD-10-CM | POA: Diagnosis not present

## 2023-02-10 DIAGNOSIS — E039 Hypothyroidism, unspecified: Secondary | ICD-10-CM | POA: Diagnosis not present

## 2023-02-10 DIAGNOSIS — E7800 Pure hypercholesterolemia, unspecified: Secondary | ICD-10-CM | POA: Diagnosis not present

## 2023-02-10 DIAGNOSIS — E6609 Other obesity due to excess calories: Secondary | ICD-10-CM | POA: Diagnosis not present

## 2023-02-10 DIAGNOSIS — K219 Gastro-esophageal reflux disease without esophagitis: Secondary | ICD-10-CM | POA: Diagnosis not present

## 2023-02-10 DIAGNOSIS — Z6832 Body mass index (BMI) 32.0-32.9, adult: Secondary | ICD-10-CM | POA: Diagnosis not present

## 2023-02-14 DIAGNOSIS — Z86018 Personal history of other benign neoplasm: Secondary | ICD-10-CM | POA: Diagnosis not present

## 2023-02-14 DIAGNOSIS — Z85828 Personal history of other malignant neoplasm of skin: Secondary | ICD-10-CM | POA: Diagnosis not present

## 2023-02-14 DIAGNOSIS — Z872 Personal history of diseases of the skin and subcutaneous tissue: Secondary | ICD-10-CM | POA: Diagnosis not present

## 2023-02-14 DIAGNOSIS — L578 Other skin changes due to chronic exposure to nonionizing radiation: Secondary | ICD-10-CM | POA: Diagnosis not present

## 2023-02-14 DIAGNOSIS — A630 Anogenital (venereal) warts: Secondary | ICD-10-CM | POA: Diagnosis not present

## 2023-02-15 DIAGNOSIS — E291 Testicular hypofunction: Secondary | ICD-10-CM | POA: Diagnosis not present

## 2023-02-15 DIAGNOSIS — I10 Essential (primary) hypertension: Secondary | ICD-10-CM | POA: Diagnosis not present

## 2023-02-15 DIAGNOSIS — E040 Nontoxic diffuse goiter: Secondary | ICD-10-CM | POA: Diagnosis not present

## 2023-02-15 DIAGNOSIS — E559 Vitamin D deficiency, unspecified: Secondary | ICD-10-CM | POA: Diagnosis not present

## 2023-02-15 DIAGNOSIS — K219 Gastro-esophageal reflux disease without esophagitis: Secondary | ICD-10-CM | POA: Diagnosis not present

## 2023-02-15 DIAGNOSIS — Z6831 Body mass index (BMI) 31.0-31.9, adult: Secondary | ICD-10-CM | POA: Diagnosis not present

## 2023-02-15 DIAGNOSIS — E7800 Pure hypercholesterolemia, unspecified: Secondary | ICD-10-CM | POA: Diagnosis not present

## 2023-02-15 DIAGNOSIS — E6609 Other obesity due to excess calories: Secondary | ICD-10-CM | POA: Diagnosis not present

## 2023-03-03 DIAGNOSIS — G4733 Obstructive sleep apnea (adult) (pediatric): Secondary | ICD-10-CM | POA: Diagnosis not present

## 2023-03-09 DIAGNOSIS — E039 Hypothyroidism, unspecified: Secondary | ICD-10-CM | POA: Diagnosis not present

## 2023-03-09 DIAGNOSIS — E559 Vitamin D deficiency, unspecified: Secondary | ICD-10-CM | POA: Diagnosis not present

## 2023-03-09 DIAGNOSIS — Z6833 Body mass index (BMI) 33.0-33.9, adult: Secondary | ICD-10-CM | POA: Diagnosis not present

## 2023-03-09 DIAGNOSIS — E6609 Other obesity due to excess calories: Secondary | ICD-10-CM | POA: Diagnosis not present

## 2023-03-09 DIAGNOSIS — E291 Testicular hypofunction: Secondary | ICD-10-CM | POA: Diagnosis not present

## 2023-03-09 DIAGNOSIS — E7800 Pure hypercholesterolemia, unspecified: Secondary | ICD-10-CM | POA: Diagnosis not present

## 2023-03-09 DIAGNOSIS — I10 Essential (primary) hypertension: Secondary | ICD-10-CM | POA: Diagnosis not present

## 2023-03-15 DIAGNOSIS — M461 Sacroiliitis, not elsewhere classified: Secondary | ICD-10-CM | POA: Diagnosis not present

## 2023-03-15 DIAGNOSIS — M9903 Segmental and somatic dysfunction of lumbar region: Secondary | ICD-10-CM | POA: Diagnosis not present

## 2023-03-15 DIAGNOSIS — M5137 Other intervertebral disc degeneration, lumbosacral region: Secondary | ICD-10-CM | POA: Diagnosis not present

## 2023-03-15 DIAGNOSIS — M9905 Segmental and somatic dysfunction of pelvic region: Secondary | ICD-10-CM | POA: Diagnosis not present

## 2023-03-29 DIAGNOSIS — E559 Vitamin D deficiency, unspecified: Secondary | ICD-10-CM | POA: Diagnosis not present

## 2023-03-29 DIAGNOSIS — E6609 Other obesity due to excess calories: Secondary | ICD-10-CM | POA: Diagnosis not present

## 2023-03-29 DIAGNOSIS — M1000 Idiopathic gout, unspecified site: Secondary | ICD-10-CM | POA: Diagnosis not present

## 2023-03-29 DIAGNOSIS — I10 Essential (primary) hypertension: Secondary | ICD-10-CM | POA: Diagnosis not present

## 2023-03-29 DIAGNOSIS — Z6833 Body mass index (BMI) 33.0-33.9, adult: Secondary | ICD-10-CM | POA: Diagnosis not present

## 2023-03-29 DIAGNOSIS — E7800 Pure hypercholesterolemia, unspecified: Secondary | ICD-10-CM | POA: Diagnosis not present

## 2023-03-29 DIAGNOSIS — E291 Testicular hypofunction: Secondary | ICD-10-CM | POA: Diagnosis not present

## 2023-03-29 DIAGNOSIS — E039 Hypothyroidism, unspecified: Secondary | ICD-10-CM | POA: Diagnosis not present

## 2023-03-29 DIAGNOSIS — K219 Gastro-esophageal reflux disease without esophagitis: Secondary | ICD-10-CM | POA: Diagnosis not present

## 2023-04-05 DIAGNOSIS — Z0001 Encounter for general adult medical examination with abnormal findings: Secondary | ICD-10-CM | POA: Diagnosis not present

## 2023-04-05 DIAGNOSIS — G4733 Obstructive sleep apnea (adult) (pediatric): Secondary | ICD-10-CM | POA: Diagnosis not present

## 2023-04-05 DIAGNOSIS — E7800 Pure hypercholesterolemia, unspecified: Secondary | ICD-10-CM | POA: Diagnosis not present

## 2023-04-05 DIAGNOSIS — E039 Hypothyroidism, unspecified: Secondary | ICD-10-CM | POA: Diagnosis not present

## 2023-04-05 DIAGNOSIS — Z136 Encounter for screening for cardiovascular disorders: Secondary | ICD-10-CM | POA: Diagnosis not present

## 2023-04-05 DIAGNOSIS — Z1211 Encounter for screening for malignant neoplasm of colon: Secondary | ICD-10-CM | POA: Diagnosis not present

## 2023-04-05 DIAGNOSIS — E559 Vitamin D deficiency, unspecified: Secondary | ICD-10-CM | POA: Diagnosis not present

## 2023-04-05 DIAGNOSIS — I10 Essential (primary) hypertension: Secondary | ICD-10-CM | POA: Diagnosis not present

## 2023-04-05 DIAGNOSIS — E6609 Other obesity due to excess calories: Secondary | ICD-10-CM | POA: Diagnosis not present

## 2023-04-05 DIAGNOSIS — Z1331 Encounter for screening for depression: Secondary | ICD-10-CM | POA: Diagnosis not present

## 2023-04-05 DIAGNOSIS — Z6833 Body mass index (BMI) 33.0-33.9, adult: Secondary | ICD-10-CM | POA: Diagnosis not present

## 2023-04-13 DIAGNOSIS — M9905 Segmental and somatic dysfunction of pelvic region: Secondary | ICD-10-CM | POA: Diagnosis not present

## 2023-04-13 DIAGNOSIS — M5137 Other intervertebral disc degeneration, lumbosacral region: Secondary | ICD-10-CM | POA: Diagnosis not present

## 2023-04-13 DIAGNOSIS — M461 Sacroiliitis, not elsewhere classified: Secondary | ICD-10-CM | POA: Diagnosis not present

## 2023-04-13 DIAGNOSIS — M9903 Segmental and somatic dysfunction of lumbar region: Secondary | ICD-10-CM | POA: Diagnosis not present

## 2023-04-20 DIAGNOSIS — E039 Hypothyroidism, unspecified: Secondary | ICD-10-CM | POA: Diagnosis not present

## 2023-04-20 DIAGNOSIS — Z6833 Body mass index (BMI) 33.0-33.9, adult: Secondary | ICD-10-CM | POA: Diagnosis not present

## 2023-04-20 DIAGNOSIS — E6609 Other obesity due to excess calories: Secondary | ICD-10-CM | POA: Diagnosis not present

## 2023-04-20 DIAGNOSIS — E291 Testicular hypofunction: Secondary | ICD-10-CM | POA: Diagnosis not present

## 2023-04-20 DIAGNOSIS — G4733 Obstructive sleep apnea (adult) (pediatric): Secondary | ICD-10-CM | POA: Diagnosis not present

## 2023-04-20 DIAGNOSIS — I10 Essential (primary) hypertension: Secondary | ICD-10-CM | POA: Diagnosis not present

## 2023-04-20 DIAGNOSIS — E559 Vitamin D deficiency, unspecified: Secondary | ICD-10-CM | POA: Diagnosis not present

## 2023-05-10 DIAGNOSIS — I10 Essential (primary) hypertension: Secondary | ICD-10-CM | POA: Diagnosis not present

## 2023-05-10 DIAGNOSIS — E291 Testicular hypofunction: Secondary | ICD-10-CM | POA: Diagnosis not present

## 2023-05-10 DIAGNOSIS — G4733 Obstructive sleep apnea (adult) (pediatric): Secondary | ICD-10-CM | POA: Diagnosis not present

## 2023-05-10 DIAGNOSIS — E7800 Pure hypercholesterolemia, unspecified: Secondary | ICD-10-CM | POA: Diagnosis not present

## 2023-05-10 DIAGNOSIS — E040 Nontoxic diffuse goiter: Secondary | ICD-10-CM | POA: Diagnosis not present

## 2023-05-10 DIAGNOSIS — Z6833 Body mass index (BMI) 33.0-33.9, adult: Secondary | ICD-10-CM | POA: Diagnosis not present

## 2023-05-10 DIAGNOSIS — E6609 Other obesity due to excess calories: Secondary | ICD-10-CM | POA: Diagnosis not present

## 2023-05-17 DIAGNOSIS — M461 Sacroiliitis, not elsewhere classified: Secondary | ICD-10-CM | POA: Diagnosis not present

## 2023-05-17 DIAGNOSIS — M5137 Other intervertebral disc degeneration, lumbosacral region: Secondary | ICD-10-CM | POA: Diagnosis not present

## 2023-05-17 DIAGNOSIS — M9903 Segmental and somatic dysfunction of lumbar region: Secondary | ICD-10-CM | POA: Diagnosis not present

## 2023-05-17 DIAGNOSIS — M9905 Segmental and somatic dysfunction of pelvic region: Secondary | ICD-10-CM | POA: Diagnosis not present

## 2023-06-01 DIAGNOSIS — G4733 Obstructive sleep apnea (adult) (pediatric): Secondary | ICD-10-CM | POA: Diagnosis not present

## 2023-06-01 DIAGNOSIS — Z6833 Body mass index (BMI) 33.0-33.9, adult: Secondary | ICD-10-CM | POA: Diagnosis not present

## 2023-06-01 DIAGNOSIS — E291 Testicular hypofunction: Secondary | ICD-10-CM | POA: Diagnosis not present

## 2023-06-01 DIAGNOSIS — E6609 Other obesity due to excess calories: Secondary | ICD-10-CM | POA: Diagnosis not present

## 2023-06-01 DIAGNOSIS — I10 Essential (primary) hypertension: Secondary | ICD-10-CM | POA: Diagnosis not present

## 2023-06-01 DIAGNOSIS — E039 Hypothyroidism, unspecified: Secondary | ICD-10-CM | POA: Diagnosis not present

## 2023-06-01 DIAGNOSIS — J309 Allergic rhinitis, unspecified: Secondary | ICD-10-CM | POA: Diagnosis not present

## 2023-06-01 DIAGNOSIS — E559 Vitamin D deficiency, unspecified: Secondary | ICD-10-CM | POA: Diagnosis not present

## 2023-06-21 ENCOUNTER — Ambulatory Visit (INDEPENDENT_AMBULATORY_CARE_PROVIDER_SITE_OTHER): Payer: BC Managed Care – PPO

## 2023-06-21 ENCOUNTER — Ambulatory Visit
Admission: EM | Admit: 2023-06-21 | Discharge: 2023-06-21 | Disposition: A | Discharge status: Home / Self Care | Payer: BC Managed Care – PPO | Attending: Physician Assistant | Admitting: Physician Assistant

## 2023-06-21 DIAGNOSIS — E7800 Pure hypercholesterolemia, unspecified: Secondary | ICD-10-CM | POA: Diagnosis not present

## 2023-06-21 DIAGNOSIS — R0602 Shortness of breath: Secondary | ICD-10-CM

## 2023-06-21 DIAGNOSIS — R0989 Other specified symptoms and signs involving the circulatory and respiratory systems: Secondary | ICD-10-CM | POA: Diagnosis not present

## 2023-06-21 DIAGNOSIS — Z9189 Other specified personal risk factors, not elsewhere classified: Secondary | ICD-10-CM | POA: Diagnosis not present

## 2023-06-21 DIAGNOSIS — J029 Acute pharyngitis, unspecified: Secondary | ICD-10-CM | POA: Diagnosis not present

## 2023-06-21 DIAGNOSIS — Z20822 Contact with and (suspected) exposure to covid-19: Secondary | ICD-10-CM | POA: Diagnosis not present

## 2023-06-21 DIAGNOSIS — R051 Acute cough: Secondary | ICD-10-CM

## 2023-06-21 DIAGNOSIS — E039 Hypothyroidism, unspecified: Secondary | ICD-10-CM | POA: Diagnosis not present

## 2023-06-21 DIAGNOSIS — R059 Cough, unspecified: Secondary | ICD-10-CM | POA: Diagnosis not present

## 2023-06-21 DIAGNOSIS — J209 Acute bronchitis, unspecified: Secondary | ICD-10-CM

## 2023-06-21 DIAGNOSIS — U071 COVID-19: Secondary | ICD-10-CM | POA: Diagnosis not present

## 2023-06-21 DIAGNOSIS — E559 Vitamin D deficiency, unspecified: Secondary | ICD-10-CM | POA: Diagnosis not present

## 2023-06-21 DIAGNOSIS — K219 Gastro-esophageal reflux disease without esophagitis: Secondary | ICD-10-CM | POA: Diagnosis not present

## 2023-06-21 DIAGNOSIS — Z6834 Body mass index (BMI) 34.0-34.9, adult: Secondary | ICD-10-CM | POA: Diagnosis not present

## 2023-06-21 DIAGNOSIS — E291 Testicular hypofunction: Secondary | ICD-10-CM | POA: Diagnosis not present

## 2023-06-21 DIAGNOSIS — E6609 Other obesity due to excess calories: Secondary | ICD-10-CM | POA: Diagnosis not present

## 2023-06-21 DIAGNOSIS — I10 Essential (primary) hypertension: Secondary | ICD-10-CM | POA: Diagnosis not present

## 2023-06-21 LAB — SARS CORONAVIRUS 2 BY RT PCR: SARS Coronavirus 2 by RT PCR: POSITIVE — AB

## 2023-06-21 LAB — GROUP A STREP BY PCR: Group A Strep by PCR: NOT DETECTED

## 2023-06-21 MED ORDER — PROMETHAZINE-DM 6.25-15 MG/5ML PO SYRP: 5.0000 mL | ORAL_SOLUTION | Freq: Four times a day (QID) | ORAL | 0 refills | Status: DC | PRN

## 2023-06-21 MED ORDER — ALBUTEROL SULFATE HFA 108 (90 BASE) MCG/ACT IN AERS: 2.0000 | INHALATION_SPRAY | RESPIRATORY_TRACT | 0 refills | Status: DC | PRN

## 2023-06-21 NOTE — ED Triage Notes (Signed)
Pt c/o HA,chills,sore throat & abd cramping x1 day. Denies any fevers. No otc tx. States wife tested + for covid yesterday.

## 2023-06-21 NOTE — Discharge Instructions (Addendum)
-  COVID is positive.  You declined antiviral therapy. - Sent cough medicine and an inhaler to the pharmacy.  Increase rest and fluids. - You should isolate and stay home until you are fever free for 24 hours and your symptoms are improving. - You need to be seen again if you have any uncontrolled fever, weakness, breathing difficulty not relieved by use of the inhaler.

## 2023-06-21 NOTE — ED Provider Notes (Signed)
[facility]    CSN: [id number] Arrival date & time: 06/21/23  1030      History   Chief Complaint Chief Complaint  Patient presents with   Headache   Chills   Sore Throat   Abdominal Cramping    HPI Eugene [name] is a 57 y.o. male presenting for fatigue, body aches, headaches, chills, sore throat, cough, congestion, shortness of breath, abdominal cramping since last night.  Patient reports his wife was seen here yesterday and tested positive for COVID.  He denies known fever.  No nausea/vomiting or diarrhea.  Medical history significant for hypertension, sleep apnea, thyroid disease and arthritis.  He has not been taking any OTC meds.  HPI  Past Medical History:  Diagnosis Date   Arthritis    Hypertension    Sleep apnea    Thyroid disease     Patient Active Problem List   Diagnosis Date Noted   Abnormal gait 01/25/2022   Knee pain 01/25/2022   Knee stiff 01/25/2022   Rupture of quadriceps tendon 01/25/2022   OSA (obstructive sleep apnea) 07/10/2018   Rupture of tendon of biceps, long head 06/12/2018   Shoulder strain 06/12/2018    Past Surgical History:  Procedure Laterality Date   knee sugery Right    left elbow surgery     SHOULDER ARTHROSCOPY         Home Medications    Prior to Admission medications   Medication Sig Start Date End Date Taking? Authorizing Provider  allopurinol (ZYLOPRIM) 100 MG tablet Take 100 mg by mouth daily.   Yes [provider]  amoxicillin-clavulanate (AUGMENTIN) 875-125 MG tablet Take 1 tablet by mouth every 12 (twelve) hours. 05/17/22  Yes Rhys Martini, PA-C  atorvastatin (LIPITOR) 10 MG tablet atorvastatin 10 mg tablet   Yes [provider]  atorvastatin (LIPITOR) 80 MG tablet atorvastatin 80 mg tablet   Yes [provider]  benzonatate (TESSALON) 100 MG capsule Take 2 capsules (200 mg total) by mouth every 8 (eight) hours. 02/21/22  Yes Becky Augusta, NP  carvedilol (COREG) 12.5 MG  tablet Take 12.5 mg by mouth 2 (two) times daily with a meal.   Yes [provider]  ergocalciferol (VITAMIN D2) 1.25 MG (50000 UT) capsule ergocalciferol (vitamin D2) 1,250 mcg (50,000 unit) capsule  TAKE 1 CAPSULE BY MOUTH WEEKLY   Yes [provider]  finasteride (PROPECIA) 1 MG tablet Take 1 tablet by mouth daily.   Yes [provider]  fluticasone (FLONASE) 50 MCG/ACT nasal spray fluticasone propionate 50 mcg/actuation nasal spray,suspension   Yes [provider]  furosemide (LASIX) 20 MG tablet furosemide 20 mg tablet   Yes [provider]  gemfibrozil (LOPID) 600 MG tablet gemfibrozil 600 mg tablet   Yes [provider]  hydrocortisone 2.5 % cream hydrocortisone 2.5 % topical cream   Yes [provider]  ipratropium (ATROVENT) 0.06 % nasal spray Place 2 sprays into both nostrils 4 (four) times daily. 02/21/22  Yes Becky Augusta, NP  ketoconazole (NIZORAL) 2 % shampoo ketoconazole 2 % shampoo   Yes [provider]  levothyroxine (SYNTHROID, LEVOTHROID) 25 MCG tablet Take 25 mcg by mouth daily before breakfast.   Yes [provider]  lisinopril-hydrochlorothiazide (ZESTORETIC) 20-25 MG tablet lisinopril 20 mg-hydrochlorothiazide 25 mg tablet  TAKE 1 TABLET BY MOUTH EVERY DAY   Yes [provider]  METHYLPREDNISOLONE PO methylprednisolone 4 mg tablets in a dose pack   Yes [provider]  minoxidil (  LONITEN) 2.5 MG tablet Take 2.5 mg by mouth daily. 01/20/22  Yes [provider]  mometasone (NASONEX) 50 MCG/ACT nasal spray mometasone 50 mcg/actuation nasal spray   Yes [provider]  Multiple Vitamin (MULTIVITAMIN) capsule Take 1 capsule by mouth daily.   Yes [provider]  naproxen (EC NAPROSYN) 500 MG EC tablet EC-Naproxen 500 mg tablet,delayed release  TAKE 1 TABLET BY MOUTH TWICE A DAY   Yes [provider]  olopatadine (PATANOL) 0.1 % ophthalmic solution  olopatadine 0.1 % eye drops   Yes [provider]  omega-3 acid ethyl esters (LOVAZA) 1 g capsule Take 1 capsule by mouth 2 (two) times daily. 03/31/23  Yes [provider]  Omega-3 Fatty Acids (FISH-EPA PO) Take by mouth.   Yes [provider]  predniSONE (STERAPRED UNI-PAK 21 TAB) 10 MG (21) TBPK tablet Take by mouth daily. Take 6 tabs by mouth daily  for 2 days, then 5 tabs for 2 days, then 4 tabs for 2 days, then 3 tabs for 2 days, 2 tabs for 2 days, then 1 tab by mouth daily for 2 days 05/17/22  Yes Cheree Ditto, Lyman Speller, PA-C  promethazine-dextromethorphan (PROMETHAZINE-DM) 6.25-15 MG/5ML syrup Take 5 mLs by mouth 4 (four) times daily as needed. 06/21/23  Yes Shirlee Latch, PA-C  Spacer/Aero-Holding Chambers (AEROCHAMBER MV) inhaler Use as instructed 02/21/22  Yes Becky Augusta, NP  testosterone enanthate (DELATESTRYL) 200 MG/ML injection Inject into the muscle.   Yes [provider]  albuterol (VENTOLIN HFA) 108 (90 Base) MCG/ACT inhaler Inhale 2 puffs into the lungs every 4 (four) hours as needed for shortness of breath. 06/21/23   Shirlee Latch, PA-C    Family History Family History  Problem Relation Age of Onset   Hypertension Father     Social History Social History   Tobacco Use   Smoking status: Former   Smokeless tobacco: Never  Advertising account planner   Vaping status: Never Used  Substance Use Topics   Alcohol use: Yes    Comment: beer 1-2 a week   Drug use: No     Allergies   Other and Cat hair extract   Review of Systems Review of Systems  Constitutional:  Positive for chills and fatigue. Negative for fever.  HENT:  Positive for congestion, rhinorrhea and sore throat. Negative for sinus pressure and sinus pain.   Respiratory:  Positive for cough and shortness of breath.   Cardiovascular:  Negative for chest pain.  Gastrointestinal:  Positive for abdominal pain. Negative for diarrhea, nausea and vomiting.  Musculoskeletal:  Negative for  myalgias.  Neurological:  Positive for headaches. Negative for weakness and light-headedness.  Hematological:  Negative for adenopathy.     Physical Exam Triage Vital Signs ED Triage Vitals  Encounter Vitals Group     BP      Systolic BP Percentile      Diastolic BP Percentile      Pulse      Resp      Temp      Temp src      SpO2      Weight      Height      Head Circumference      Peak Flow      Pain Score      Pain Loc      Pain Education      Exclude from Growth Chart    No data found.  Updated Vital Signs BP (!) 172/95 (  BP Location: Left Arm)   Pulse 70   Temp 98.8 F (37.1 C) (Oral)   Resp 16   Ht 6' (1.829 m)   Wt 230 lb (104.3 kg)   SpO2 99%   BMI 31.19 kg/m   Physical Exam Vitals and nursing note reviewed.  Constitutional:      General: He is not in acute distress.    Appearance: Normal appearance. He is well-developed. He is ill-appearing.  HENT:     Head: Normocephalic and atraumatic.     Nose: Congestion present.     Mouth/Throat:     Mouth: Mucous membranes are moist.     Pharynx: Oropharynx is clear. Posterior oropharyngeal erythema present.  Eyes:     General: No scleral icterus.    Conjunctiva/sclera: Conjunctivae normal.  Cardiovascular:     Rate and Rhythm: Normal rate and regular rhythm.     Heart sounds: Normal heart sounds.  Pulmonary:     Effort: Pulmonary effort is normal. No respiratory distress.     Breath sounds: Examination of the right-upper field reveals rhonchi. Examination of the right-middle field reveals rhonchi. Rhonchi present.  Musculoskeletal:     Cervical back: Neck supple.  Skin:    General: Skin is warm and dry.     Capillary Refill: Capillary refill takes less than 2 seconds.  Neurological:     General: No focal deficit present.     Mental Status: He is alert. Mental status is at baseline.     Motor: No weakness.     Gait: Gait normal.  Psychiatric:        Mood and Affect: Mood normal.        Behavior:  Behavior normal.      UC Treatments / Results  Labs (all labs ordered are listed, but only abnormal results are displayed) Labs Reviewed  SARS CORONAVIRUS 2 BY RT PCR - Abnormal; Notable for the following components:      Result Value   SARS Coronavirus 2 by RT PCR POSITIVE (*)    All other components within normal limits  GROUP A STREP BY PCR    EKG   Radiology DG Chest 2 View  Result Date: 06/21/2023 CLINICAL DATA:  Cough and congestion. Right upper lung rhonchi. Shortness of breath. Wife tested positive for COVID yesterday. EXAM: CHEST - 2 VIEW COMPARISON:  09/06/2015 FINDINGS: Unchanged cardiac silhouette and mediastinal contours. No focal parenchymal opacities. No pleural effusion or pneumothorax. No evidence of edema. No acute osseous abnormalities. Post right-sided rotator cuff repair. IMPRESSION: No acute cardiopulmonary disease. Specifically, no evidence of pneumonia. Electronically Signed   By: Simonne Come M.D.   On: 06/21/2023 11:51    Procedures Procedures (including critical care time)  Medications Ordered in UC Medications - No data to display  Initial Impression / Assessment and Plan / UC Course  I have reviewed the triage vital signs and the nursing notes.  Pertinent labs & imaging results that were available during my care of the patient were reviewed by me and considered in my medical decision making (see chart for details).   57 year old male presents for fatigue, cough, congestion, sore throat, body aches, chills, shortness of breath that began last night.  Has been exposed to COVID through his wife.  He is afebrile.  Mildly ill-appearing but nontoxic.  On exam has nasal congestion, erythema posterior pharynx.  Rhonchi of right upper and middle lung fields.   COVID and strep testing obtained.  Chest x-ray obtained to assess  for possible pneumonia given rhonchi of right upper and middle lung fields.  Strep negative.  COVID-positive.  X-ray normal.   Discussed all these results with patient.  He declines antiviral therapy.  Sent Promethazine DM to pharmacy.  Sent albuterol inhaler to pharmacy.  Patient requested it.  I also suspect he has mild bronchitis.  Encouraged him to increase rest and fluids.  Reviewed current CDC guidelines, isolation protocol and ED precautions.  1 acute illness with systemic symptoms.  Final Clinical Impressions(s) / UC Diagnoses   Final diagnoses:  COVID-19  Acute cough  Acute bronchitis, unspecified organism  Sore throat  Shortness of breath  At increased risk of exposure to COVID-19 virus     Discharge Instructions      -COVID is positive.  You declined antiviral therapy. - Sent cough medicine and an inhaler to the pharmacy.  Increase rest and fluids. - You should isolate and stay home until you are fever free for 24 hours and your symptoms are improving. - You need to be seen again if you have any uncontrolled fever, weakness, breathing difficulty not relieved by use of the inhaler.     ED Prescriptions     Medication Sig Dispense Auth. Provider   albuterol (VENTOLIN HFA) 108 (90 Base) MCG/ACT inhaler Inhale 2 puffs into the lungs every 4 (four) hours as needed for shortness of breath. 18 g Shirlee Latch, PA-C   promethazine-dextromethorphan (PROMETHAZINE-DM) 6.25-15 MG/5ML syrup Take 5 mLs by mouth 4 (four) times daily as needed. 118 mL Shirlee Latch, PA-C      PDMP not reviewed this encounter.   Shirlee Latch, PA-C 06/21/23 1208

## 2023-07-01 DIAGNOSIS — G4733 Obstructive sleep apnea (adult) (pediatric): Secondary | ICD-10-CM | POA: Diagnosis not present

## 2023-07-04 ENCOUNTER — Telehealth: Payer: Self-pay

## 2023-07-04 ENCOUNTER — Encounter: Payer: Self-pay | Admitting: Physician Assistant

## 2023-07-04 ENCOUNTER — Other Ambulatory Visit: Payer: Self-pay

## 2023-07-04 ENCOUNTER — Ambulatory Visit: Payer: BC Managed Care – PPO | Admitting: Physician Assistant

## 2023-07-04 VITALS — BP 142/98 | HR 65 | Ht 72.0 in | Wt 230.0 lb

## 2023-07-04 DIAGNOSIS — E039 Hypothyroidism, unspecified: Secondary | ICD-10-CM

## 2023-07-04 DIAGNOSIS — E041 Nontoxic single thyroid nodule: Secondary | ICD-10-CM

## 2023-07-04 DIAGNOSIS — Z8639 Personal history of other endocrine, nutritional and metabolic disease: Secondary | ICD-10-CM

## 2023-07-04 DIAGNOSIS — E291 Testicular hypofunction: Secondary | ICD-10-CM

## 2023-07-04 DIAGNOSIS — Z125 Encounter for screening for malignant neoplasm of prostate: Secondary | ICD-10-CM

## 2023-07-04 DIAGNOSIS — M1A0710 Idiopathic chronic gout, right ankle and foot, without tophus (tophi): Secondary | ICD-10-CM

## 2023-07-04 DIAGNOSIS — Z1321 Encounter for screening for nutritional disorder: Secondary | ICD-10-CM | POA: Diagnosis not present

## 2023-07-04 DIAGNOSIS — I10 Essential (primary) hypertension: Secondary | ICD-10-CM

## 2023-07-04 DIAGNOSIS — Z114 Encounter for screening for human immunodeficiency virus [HIV]: Secondary | ICD-10-CM | POA: Diagnosis not present

## 2023-07-04 DIAGNOSIS — Z1159 Encounter for screening for other viral diseases: Secondary | ICD-10-CM | POA: Diagnosis not present

## 2023-07-04 DIAGNOSIS — E785 Hyperlipidemia, unspecified: Secondary | ICD-10-CM | POA: Diagnosis not present

## 2023-07-04 MED ORDER — TESTOSTERONE CYPIONATE 200 MG/ML IM SOLN: 200.0000 mg | INTRAMUSCULAR | 1 refills | Status: DC

## 2023-07-04 MED ORDER — SHARPS CONTAINER MISC: 1.0000 | 5 refills | Status: DC | PRN

## 2023-07-04 MED ORDER — PRAVASTATIN SODIUM 20 MG PO TABS: 20.0000 mg | ORAL_TABLET | Freq: Every day | ORAL | 2 refills | Status: DC

## 2023-07-04 MED ORDER — "SYRINGE/NEEDLE (DISP) 25G X 5/8"" 1 ML MISC": 1.0000 | 1 refills | Status: DC

## 2023-07-04 NOTE — Progress Notes (Unsigned)
Date:  07/04/2023   Name:  Eugene [name]   DOB:  07/28/1966   MRN:  [id number]   Chief Complaint: Establish Care and testosterone (Wants to continue his treatment 1 every 2 week injections/)  HPI Eugene [name] is a very pleasant old male with a history of HTN, gout, and testosterone deficiency currently on TRT via IM injections every 2-3 weeks performed in office by prior PCP Eugene [name].  Patient has brought paper records with him today which are very useful, but do not contain any recent labs.  Patient states that his last labs were around the time of his prior office visit in April 2024.  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  Review of notes from last PCP visit 04/05/2023 mentions therapeutic testosterone level, normal vitamin D, high lipids, normal CMP and CBC, normal spirometry, 6 mm right lobe thyroid nodule, normal DEXA, normal carotid Doppler, normal echo and stress EKG.  Tdap 03/30/2022  He admits to poor compliance with atorvastatin because of myalgia.  He has not tried any other statin medications.  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   Medication list has been reviewed and updated.  Current Meds  Medication Sig   albuterol (VENTOLIN HFA) 108 (90 Base) MCG/ACT inhaler Inhale 2 puffs into the lungs every 4 (four) hours as needed for shortness of breath.   allopurinol (ZYLOPRIM) 300 MG tablet Take 300 mg by mouth daily.   carvedilol (COREG) 12.5 MG tablet Take 12.5 mg by mouth 2 (two) times daily with a meal.   ergocalciferol (VITAMIN D2) 1.25 MG (50000 UT) capsule ergocalciferol (vitamin D2) 1,250 mcg (50,000 unit) capsule  TAKE 1 CAPSULE BY MOUTH WEEKLY   finasteride (PROPECIA) 1 MG tablet Take 1 tablet by mouth daily.   fluticasone (FLONASE) 50 MCG/ACT nasal spray fluticasone propionate 50 mcg/actuation nasal spray,suspension   furosemide (LASIX) 20 MG tablet 20 mg daily.    gemfibrozil (LOPID) 600 MG tablet 2 (two) times daily before a meal.   hydrocortisone 2.5 % cream as needed.   lisinopril-hydrochlorothiazide (ZESTORETIC) 20-25 MG tablet lisinopril 20 mg-hydrochlorothiazide 25 mg tablet  TAKE 1 TABLET BY MOUTH EVERY DAY   minoxidil (LONITEN) 2.5 MG tablet Take 2.5 mg by mouth daily.   Multiple Vitamin (MULTIVITAMIN) capsule Take 1 capsule by mouth daily.   naproxen (EC NAPROSYN) 500 MG EC tablet as needed.   olopatadine (PATANOL) 0.1 % ophthalmic solution Place into both eyes as needed for allergies.   omega-3 acid ethyl esters (LOVAZA) 1 g capsule Take 1 capsule by mouth 2 (two) times daily.   pravastatin (PRAVACHOL) 20 MG tablet Take 1 tablet (20 mg total) by mouth daily.   sharps container 1 each by Does not apply route as needed.   Spacer/Aero-Holding Chambers (AEROCHAMBER MV) inhaler Use as instructed   SYNTHROID 75 MCG tablet Take 75 mcg by mouth daily before breakfast.   SYRINGE/NEEDLE, DISP, 1 ML 25G X 5/8" 1 ML MISC 1 each by Does not apply route every 14 (fourteen) days.   testosterone cypionate (DEPOTESTOSTERONE CYPIONATE) 200 MG/ML injection Inject 1 mL (200 mg total) into the muscle every 14 (fourteen) days. Store at room temperature in a dark place   [DISCONTINUED] allopurinol (ZYLOPRIM) 100 MG tablet Take 100 mg by mouth daily.   [DISCONTINUED] levothyroxine (SYNTHROID, LEVOTHROID) 25 MCG tablet Take 25 mcg by mouth daily before breakfast.   [DISCONTINUED] testosterone enanthate (  DELATESTRYL) 200 MG/ML injection Inject into the muscle. Once every 3 weeks     Review of Systems  Constitutional:  Negative for fatigue and fever.  Respiratory:  Negative for chest tightness and shortness of breath.   Cardiovascular:  Negative for chest pain and palpitations.  Gastrointestinal:  Negative for abdominal pain.    Patient Active Problem List   Diagnosis Date Noted   Alopecia 07/05/2023   GERD (gastroesophageal reflux disease) 07/05/2023   Chronic  gout of right foot 07/05/2023   Mixed hyperlipidemia 07/05/2023   Erectile dysfunction 07/05/2023   Hypothyroidism 07/05/2023   Testosterone deficiency in male 07/05/2023   History of vitamin D deficiency 07/05/2023   Class 1 obesity with serious comorbidity and body mass index (BMI) of 31.0 to 31.9 in adult 07/05/2023   Primary hypertension 07/04/2023   Abnormal gait 01/25/2022   Knee pain 01/25/2022   Rupture of quadriceps tendon 01/25/2022   OSA (obstructive sleep apnea) 07/10/2018   Rupture of tendon of biceps, long head 06/12/2018    Allergies  Allergen Reactions   Other Other (See Comments)    Dog Dander. [employer].    Cat Hair Extract     Other reaction(s): Other (See Comments) Cat/dog dander - watery eyes   Atorvastatin Other (See Comments)    myalgia    Immunization History  Administered Date(s) Administered   Influenza-Unspecified 09/22/2022   Moderna Covid-19 Vaccine Bivalent Booster 65yrs & up 09/11/2021   Moderna Sars-Covid-2 Vaccination 11/21/2020, 04/24/2021   PFIZER(Purple Top)SARS-COV-2 Vaccination 02/27/2020, 03/19/2020   Pneumococcal Polysaccharide-23 10/12/2010, 01/14/2015   Td 10/12/2010, 03/30/2022   Tdap 03/30/2022   Zoster Recombinant(Shingrix) 09/19/2019, 11/27/2019    Past Surgical History:  Procedure Laterality Date   knee sugery Right    left elbow surgery     SHOULDER ARTHROSCOPY      Social History   Tobacco Use   Smoking status: Former   Smokeless tobacco: Never  Vaping Use   Vaping status: Never Used  Substance Use Topics   Alcohol use: Yes    Comment: beer 1-2 a week   Drug use: No    Family History  Problem Relation Age of Onset   Hypertension Father         07/04/2023    3:38 PM  GAD 7 : Generalized Anxiety Score  Nervous, Anxious, on Edge 0  Control/stop worrying 0  Worry too much - different things 0  Trouble relaxing 0  Restless 0  Easily annoyed or irritable 0  Afraid - awful might happen 0  Total  GAD 7 Score 0  Anxiety Difficulty Not difficult at all       07/04/2023    3:38 PM 07/10/2018    8:45 AM  Depression screen PHQ 2/9  Decreased Interest 0 0  Down, Depressed, Hopeless 0 0  PHQ - 2 Score 0 0  Altered sleeping 0   Tired, decreased energy 2   Change in appetite 2   Feeling bad or failure about yourself  0   Trouble concentrating 0   Moving slowly or fidgety/restless 0   Suicidal thoughts 0   PHQ-9 Score 4   Difficult doing work/chores Not difficult at all     BP Readings from Last 3 Encounters:  07/04/23 (!) 142/98  06/21/23 (!) 172/95  12/16/22 (!) 149/88    Wt Readings from Last 3 Encounters:  07/04/23 230 lb (104.3 kg)  06/21/23 230 lb (104.3 kg)  12/16/22 232 lb 6.4 oz (105.4 kg)  BP (!) 142/98 (BP Location: Left Arm, Patient Position: Sitting, Cuff Size: Large)   Pulse 65   Ht 6' (1.829 m)   Wt 230 lb (104.3 kg)   SpO2 97%   BMI 31.19 kg/m   Physical Exam Vitals and nursing note reviewed.  Constitutional:      Appearance: Normal appearance.  Neck:     Vascular: No carotid bruit.  Cardiovascular:     Rate and Rhythm: Normal rate and regular rhythm.     Heart sounds: No murmur heard.    No friction rub. No gallop.  Pulmonary:     Effort: Pulmonary effort is normal.     Breath sounds: Normal breath sounds.  Abdominal:     General: There is no distension.  Musculoskeletal:        General: Normal range of motion.  Skin:    General: Skin is warm and dry.  Neurological:     Mental Status: He is alert and oriented to person, place, and time.     Gait: Gait is intact.  Psychiatric:        Mood and Affect: Mood and affect normal.     Recent Labs     Component Value Date/Time   NA 139 07/04/2023 1614   K 4.2 07/04/2023 1614   CL 100 07/04/2023 1614   CO2 22 07/04/2023 1614   GLUCOSE 89 07/04/2023 1614   BUN 10 07/04/2023 1614   CREATININE 0.91 07/04/2023 1614   CALCIUM 9.8 07/04/2023 1614   PROT 7.2 07/04/2023 1614   ALBUMIN 4.4  07/04/2023 1614   AST 24 07/04/2023 1614   ALT 25 07/04/2023 1614   ALKPHOS 93 07/04/2023 1614   BILITOT 0.3 07/04/2023 1614    Lab Results  Component Value Date   WBC 7.0 07/04/2023   HGB 15.8 07/04/2023   HCT 46.5 07/04/2023   MCV 91 07/04/2023   PLT 331 07/04/2023   No results found for: "HGBA1C" Lab Results  Component Value Date   CHOL 200 (H) 07/04/2023   HDL 31 (L) 07/04/2023   LDLCALC 120 (H) 07/04/2023   TRIG 280 (H) 07/04/2023   CHOLHDL 6.5 (H) 07/04/2023   Lab Results  Component Value Date   TSH 1.540 07/04/2023     Assessment and Plan:  1. Testosterone deficiency in male Check free and total testosterone today.  Advised patient we do not perform testosterone injections in office, but he is willing to self administer.  Because this is continuation of ongoing therapy, I will prescribe testosterone cypionate for self administration as below, discussed technique today.  Patient understands this will require routine monitoring of labs every 3 to 6 months.  - Testosterone,Free and Total - testosterone cypionate (DEPOTESTOSTERONE CYPIONATE) 200 MG/ML injection; Inject 1 mL (200 mg total) into the muscle every 14 (fourteen) days. Store at room temperature in a dark place  Dispense: 10 mL; Refill: 1 - sharps container; 1 each by Does not apply route as needed.  Dispense: 1 each; Refill: 5 - SYRINGE/NEEDLE, DISP, 1 ML 25G X 5/8" 1 ML MISC; 1 each by Does not apply route every 14 (fourteen) days.  Dispense: 50 each; Refill: 1  2. Primary hypertension Check baseline labs for HTN.  Patient has not yet taken his blood pressure medication today, advised he go ahead and take this and monitor blood pressure with a log for me to review next visit.  He is to let me know if blood pressure remains greater than 140/90 at home  with his current regimen, as he may need med adjustment prior to our next visit - CBC with Differential/Platelet - Comprehensive metabolic panel - TSH  3.  Hyperlipidemia, unspecified hyperlipidemia type Check nonfasting lipids today.  Discussed pharmacotherapy with patient, I was not able to tolerate atorvastatin 80 mg.  He was agreeable to trial of pravastatin 20 mg nightly, which hopefully will be less likely to cause myalgia - Lipid panel - pravastatin (PRAVACHOL) 20 MG tablet; Take 1 tablet (20 mg total) by mouth daily.  Dispense: 30 tablet; Refill: 2  4. Chronic gout of right foot, unspecified cause Check uric acid today.  Continue allopurinol for now - Uric acid  5. Hypothyroidism, unspecified type Continue Synthroid as prescribed  6. Right thyroid nodule Plan for repeat October 2024 to ensure stability of thyroid nodule, which appeared cold on most recent thyroid scan  7. Need for hepatitis C screening test One-time hep C screen today - Hepatitis C antibody  8. Screening for HIV (human immunodeficiency virus) One-time HIV screen today - HIV Antibody (routine testing w rflx)  9. Screening PSA (prostate specific antigen) Check PSA on testosterone therapy - PSA  10. Encounter for vitamin deficiency screening Screen for vitamin D deficiency to ensure adequate correction with his prescribed high-dose supplement - VITAMIN D 25 Hydroxy (Vit-D Deficiency, Fractures)  11. History of vitamin D deficiency Screen for vitamin D deficiency to ensure adequate correction with his prescribed high-dose supplement    Return in about 3 months (around 10/04/2023) for OV chronic conditions.    Alvester Morin, PA-C, DMSc, Nutritionist [facility] [phone]

## 2023-07-04 NOTE — Telephone Encounter (Signed)
-----   Message from Allen Norris sent at 07/04/2023  4:19 PM EDT ----- Regarding: Records Please request lab records from Dr. Real Cons office

## 2023-07-04 NOTE — Telephone Encounter (Signed)
Request sent.  KP

## 2023-07-05 ENCOUNTER — Encounter: Payer: Self-pay | Admitting: Physician Assistant

## 2023-07-05 DIAGNOSIS — G4726 Circadian rhythm sleep disorder, shift work type: Secondary | ICD-10-CM | POA: Insufficient documentation

## 2023-07-05 DIAGNOSIS — K219 Gastro-esophageal reflux disease without esophagitis: Secondary | ICD-10-CM | POA: Insufficient documentation

## 2023-07-05 DIAGNOSIS — E349 Endocrine disorder, unspecified: Secondary | ICD-10-CM | POA: Insufficient documentation

## 2023-07-05 DIAGNOSIS — E039 Hypothyroidism, unspecified: Secondary | ICD-10-CM | POA: Insufficient documentation

## 2023-07-05 DIAGNOSIS — L659 Nonscarring hair loss, unspecified: Secondary | ICD-10-CM | POA: Insufficient documentation

## 2023-07-05 DIAGNOSIS — E041 Nontoxic single thyroid nodule: Secondary | ICD-10-CM | POA: Insufficient documentation

## 2023-07-05 DIAGNOSIS — E669 Obesity, unspecified: Secondary | ICD-10-CM | POA: Insufficient documentation

## 2023-07-05 DIAGNOSIS — G4729 Other circadian rhythm sleep disorder: Secondary | ICD-10-CM | POA: Insufficient documentation

## 2023-07-05 DIAGNOSIS — Z8639 Personal history of other endocrine, nutritional and metabolic disease: Secondary | ICD-10-CM | POA: Insufficient documentation

## 2023-07-05 DIAGNOSIS — N529 Male erectile dysfunction, unspecified: Secondary | ICD-10-CM | POA: Insufficient documentation

## 2023-07-05 DIAGNOSIS — M1A0710 Idiopathic chronic gout, right ankle and foot, without tophus (tophi): Secondary | ICD-10-CM | POA: Insufficient documentation

## 2023-07-05 DIAGNOSIS — E559 Vitamin D deficiency, unspecified: Secondary | ICD-10-CM | POA: Insufficient documentation

## 2023-07-05 DIAGNOSIS — E782 Mixed hyperlipidemia: Secondary | ICD-10-CM | POA: Insufficient documentation

## 2023-07-05 DIAGNOSIS — E291 Testicular hypofunction: Secondary | ICD-10-CM | POA: Insufficient documentation

## 2023-07-05 DIAGNOSIS — M109 Gout, unspecified: Secondary | ICD-10-CM | POA: Insufficient documentation

## 2023-07-06 ENCOUNTER — Telehealth: Payer: Self-pay

## 2023-07-06 NOTE — Telephone Encounter (Signed)
Pt given lab results per notes of D. Waddell PA on 07/06/23. Pt verbalized understanding. Med list reconciled. Naproxen was given a long time ago and not currently using  Fluticasone is PRN,

## 2023-07-06 NOTE — Telephone Encounter (Signed)
Noted  KP 

## 2023-07-12 ENCOUNTER — Other Ambulatory Visit: Payer: Self-pay | Admitting: Physician Assistant

## 2023-07-12 DIAGNOSIS — E039 Hypothyroidism, unspecified: Secondary | ICD-10-CM

## 2023-07-12 MED ORDER — LEVOTHYROXINE SODIUM 75 MCG PO TABS: 75.0000 ug | ORAL_TABLET | Freq: Every day | ORAL | 3 refills | Status: AC

## 2023-07-27 ENCOUNTER — Other Ambulatory Visit: Payer: Self-pay

## 2023-07-27 MED ORDER — FLUTICASONE PROPIONATE 50 MCG/ACT NA SUSP: 1.0000 | Freq: Every day | NASAL | 0 refills | Status: DC

## 2023-09-05 ENCOUNTER — Other Ambulatory Visit: Payer: Self-pay | Admitting: Physician Assistant

## 2023-09-06 NOTE — Telephone Encounter (Signed)
Requested Prescriptions  Pending Prescriptions Disp Refills   fluticasone (FLONASE) 50 MCG/ACT nasal spray [Pharmacy Med Name: FLUTICASONE PROP 50 MCG SPRAY] 16 mL 2    Sig: SPRAY 1 SPRAY INTO BOTH NOSTRILS DAILY.     Ear, Nose, and Throat: Nasal Preparations - Corticosteroids Passed - 09/05/2023  1:28 AM      Passed - Valid encounter within last 12 months    Recent Outpatient Visits           2 months ago Testosterone deficiency in male   [facility] at [facility], [place], [place]

## 2023-09-26 ENCOUNTER — Telehealth: Payer: Self-pay | Admitting: Physician Assistant

## 2023-09-26 NOTE — Telephone Encounter (Signed)
Copied from CRM [id number]. Topic: Complaint - Billing/Coding >> Sep 26, 2023  4:35 PM Ja-Kwan M wrote: DOS: 07/04/23 Details of complaint: Pt stated he was told that the labs that were done on 07/04/23 were denied but if it was coded as medically necessary then the insurance will cover it How would the patient like to see this issue resolved? Correct the coding so insurance will cover cost of labs   Route to Research officer, political party.

## 2023-09-27 NOTE — Telephone Encounter (Signed)
Please review.  KP

## 2023-09-30 ENCOUNTER — Other Ambulatory Visit: Payer: Self-pay | Admitting: Physician Assistant

## 2023-09-30 DIAGNOSIS — E785 Hyperlipidemia, unspecified: Secondary | ICD-10-CM

## 2023-10-10 NOTE — Telephone Encounter (Signed)
Please review.  KP

## 2023-10-31 ENCOUNTER — Ambulatory Visit: Payer: BC Managed Care – PPO

## 2023-10-31 ENCOUNTER — Ambulatory Visit
Admission: EM | Admit: 2023-10-31 | Discharge: 2023-10-31 | Disposition: A | Discharge status: Home / Self Care | Payer: BC Managed Care – PPO | Attending: Emergency Medicine | Admitting: Emergency Medicine

## 2023-10-31 DIAGNOSIS — J40 Bronchitis, not specified as acute or chronic: Secondary | ICD-10-CM

## 2023-10-31 DIAGNOSIS — R058 Other specified cough: Secondary | ICD-10-CM | POA: Diagnosis not present

## 2023-10-31 MED ORDER — ALBUTEROL SULFATE HFA 108 (90 BASE) MCG/ACT IN AERS
2.0000 | INHALATION_SPRAY | RESPIRATORY_TRACT | 0 refills | Status: DC | PRN
Start: 2023-10-31 — End: 2024-01-31

## 2023-10-31 MED ORDER — DOXYCYCLINE HYCLATE 100 MG PO CAPS: 100.0000 mg | ORAL_CAPSULE | Freq: Two times a day (BID) | ORAL | 0 refills | Status: AC

## 2023-10-31 MED ORDER — BENZONATATE 100 MG PO CAPS: 200.0000 mg | ORAL_CAPSULE | Freq: Three times a day (TID) | ORAL | 0 refills | Status: DC

## 2023-10-31 MED ORDER — AEROCHAMBER MV MISC: 2 refills | Status: DC

## 2023-10-31 MED ORDER — PROMETHAZINE-DM 6.25-15 MG/5ML PO SYRP: 5.0000 mL | ORAL_SOLUTION | Freq: Four times a day (QID) | ORAL | 0 refills | Status: DC | PRN

## 2023-10-31 MED ORDER — IPRATROPIUM BROMIDE 0.06 % NA SOLN: 2.0000 | Freq: Four times a day (QID) | NASAL | 12 refills | Status: DC

## 2023-10-31 NOTE — ED Triage Notes (Signed)
Pt c/o cough,wheezing & congestion x1 wk. Has tried mucinex & advil w/o relief.

## 2023-10-31 NOTE — ED Provider Notes (Signed)
[facility]    CSN: [id number] Arrival date & time: 10/31/23  1628      History   Chief Complaint Chief Complaint  Patient presents with   Cough   Wheezing   Congestion    HPI Eugene [name] is a 57 y.o. male.   HPI  57 year old male with a past medical history significant for GERD, primary hypertension, OSA, hypothyroidism, mixed hyperlipidemia, gout, and ED presents for evaluation of 1 week worth of respiratory symptoms to include chills, runny nose nasal congestion, sore throat that was present on the first day and since resolved, and a productive cough.  He reports that this morning he coughed up some blood.  He has also been experiencing shortness of breath and wheezing.  He has not measured a fever at home.  Past Medical History:  Diagnosis Date   Allergy    Arthritis    Edema    Gout    Hair loss    Hyperlipidemia    Hypertension    Sleep apnea    Thyroid disease    Vitamin D deficiency     Patient Active Problem List   Diagnosis Date Noted   Alopecia 07/05/2023   GERD (gastroesophageal reflux disease) 07/05/2023   Chronic gout of right foot 07/05/2023   Mixed hyperlipidemia 07/05/2023   Erectile dysfunction 07/05/2023   Hypothyroidism 07/05/2023   Testosterone deficiency in male 07/05/2023   History of vitamin D deficiency 07/05/2023   Class 1 obesity with serious comorbidity and body mass index (BMI) of 31.0 to 31.9 in adult 07/05/2023   Right thyroid nodule 07/05/2023   Shifting sleep-work schedule 07/05/2023   Primary hypertension 07/04/2023   Abnormal gait 01/25/2022   Knee pain 01/25/2022   Rupture of quadriceps tendon 01/25/2022   OSA (obstructive sleep apnea) 07/10/2018   Rupture of tendon of biceps, long head 06/12/2018    Past Surgical History:  Procedure Laterality Date   knee sugery Right    left elbow surgery     SHOULDER ARTHROSCOPY Right 08/21/2009   rotator cuff repair       Home Medications    Prior to  Admission medications   Medication Sig Start Date End Date Taking? Authorizing Provider  albuterol (VENTOLIN HFA) 108 (90 Base) MCG/ACT inhaler Inhale 2 puffs into the lungs every 4 (four) hours as needed. 10/31/23  Yes Becky Augusta, NP  allopurinol (ZYLOPRIM) 300 MG tablet Take 300 mg by mouth daily.   Yes [provider]  benzonatate (TESSALON) 100 MG capsule Take 2 capsules (200 mg total) by mouth every 8 (eight) hours. 10/31/23  Yes Becky Augusta, NP  carvedilol (COREG) 12.5 MG tablet Take 12.5 mg by mouth 2 (two) times daily with a meal.   Yes [provider]  doxycycline (VIBRAMYCIN) 100 MG capsule Take 1 capsule (100 mg total) by mouth 2 (two) times daily for 7 days. 10/31/23 11/07/23 Yes Becky Augusta, NP  ergocalciferol (VITAMIN D2) 1.25 MG (50000 UT) capsule ergocalciferol (vitamin D2) 1,250 mcg (50,000 unit) capsule  TAKE 1 CAPSULE BY MOUTH WEEKLY   Yes [provider]  finasteride (PROPECIA) 1 MG tablet Take 1 tablet by mouth daily.   Yes [provider]  fluticasone (FLONASE) 50 MCG/ACT nasal spray SPRAY 1 SPRAY INTO BOTH NOSTRILS DAILY. 09/06/23  Yes Remo Lipps, PA  furosemide (LASIX) 20 MG tablet 20 mg daily.   Yes [provider]  gemfibrozil (LOPID) 600 MG tablet 2 (two) times daily before a meal.  Yes [provider]  hydrocortisone 2.5 % cream as needed.   Yes [provider]  ipratropium (ATROVENT) 0.06 % nasal spray Place 2 sprays into both nostrils 4 (four) times daily. 10/31/23  Yes Becky Augusta, NP  levothyroxine (SYNTHROID) 75 MCG tablet Take 1 tablet (75 mcg total) by mouth daily before breakfast. Take on empty stomach, first thing in the morning, full glass of water, 30 min before anything else 07/12/23  Yes Waddell, Melton Alar, PA  lisinopril-hydrochlorothiazide (ZESTORETIC) 20-25 MG tablet lisinopril 20 mg-hydrochlorothiazide 25 mg tablet  TAKE 1 TABLET BY MOUTH EVERY DAY   Yes [provider]   minoxidil (LONITEN) 2.5 MG tablet Take 2.5 mg by mouth daily. 01/20/22  Yes [provider]  Multiple Vitamin (MULTIVITAMIN) capsule Take 1 capsule by mouth daily.   Yes [provider]  naproxen (EC NAPROSYN) 500 MG EC tablet as needed.   Yes [provider]  olopatadine (PATANOL) 0.1 % ophthalmic solution Place into both eyes as needed for allergies.   Yes [provider]  omega-3 acid ethyl esters (LOVAZA) 1 g capsule Take 1 capsule by mouth 2 (two) times daily. 03/31/23  Yes [provider]  pravastatin (PRAVACHOL) 20 MG tablet Take 1 tablet (20 mg total) by mouth at bedtime. 09/30/23  Yes Remo Lipps, PA  promethazine-dextromethorphan (PROMETHAZINE-DM) 6.25-15 MG/5ML syrup Take 5 mLs by mouth 4 (four) times daily as needed. 10/31/23  Yes Becky Augusta, NP  sharps container 1 each by Does not apply route as needed. 07/04/23  Yes Remo Lipps, PA  Spacer/Aero-Holding Chambers (AEROCHAMBER MV) inhaler Use as instructed 02/21/22  Yes Becky Augusta, NP  Spacer/Aero-Holding Deretha Emory (AEROCHAMBER MV) inhaler Use as instructed 10/31/23  Yes Becky Augusta, NP  SYRINGE/NEEDLE, DISP, 1 ML 25G X 5/8" 1 ML MISC 1 each by Does not apply route every 14 (fourteen) days. 07/04/23  Yes Remo Lipps, PA  testosterone cypionate (DEPOTESTOSTERONE CYPIONATE) 200 MG/ML injection Inject 1 mL (200 mg total) into the muscle every 14 (fourteen) days. Store at room temperature in a dark place 07/04/23  Yes Waddell, Melton Alar, PA  UNABLE TO FIND TAKE TWO TABLETS BY MOUTH EVERY DAY 03/30/23  Yes [provider]    Family History Family History  Problem Relation Age of Onset   Hypertension Father    Alzheimer's disease Father    Sarcoidosis Sister        sarcoidosis lungs   Other Brother        brain damage    Social History Social History   Tobacco Use   Smoking status: Former   Smokeless tobacco: Never  Advertising account planner   Vaping status: Never Used   Substance Use Topics   Alcohol use: Yes    Comment: beer 1-2 a week   Drug use: No     Allergies   Other, Cat hair extract, and Atorvastatin   Review of Systems Review of Systems  Constitutional:  Positive for chills. Negative for fever.  HENT:  Positive for congestion, rhinorrhea and sore throat. Negative for ear pain.   Respiratory:  Positive for cough, shortness of breath and wheezing.      Physical Exam Triage Vital Signs ED Triage Vitals  Encounter Vitals Group     BP 10/31/23 1726 (!) 189/121     Systolic BP Percentile --      Diastolic BP Percentile --      Pulse Rate 10/31/23 1726 65     Resp 10/31/23  1726 16     Temp 10/31/23 1726 99.1 F (37.3 C)     Temp Source 10/31/23 1726 Oral     SpO2 10/31/23 1726 96 %     Weight 10/31/23 1726 230 lb (104.3 kg)     Height 10/31/23 1726 6' (1.829 m)     Head Circumference --      Peak Flow --      Pain Score 10/31/23 1729 0     Pain Loc --      Pain Education --      Exclude from Growth Chart --    No data found.  Updated Vital Signs BP (!) 189/121 (BP Location: Right Arm)   Pulse 65   Temp 99.1 F (37.3 C) (Oral)   Resp 16   Ht 6' (1.829 m)   Wt 230 lb (104.3 kg)   SpO2 96%   BMI 31.19 kg/m   Visual Acuity Right Eye Distance:   Left Eye Distance:   Bilateral Distance:    Right Eye Near:   Left Eye Near:    Bilateral Near:     Physical Exam Vitals and nursing note reviewed.  Constitutional:      Appearance: Normal appearance. He is not ill-appearing.  HENT:     Head: Normocephalic and atraumatic.     Right Ear: Tympanic membrane, ear canal and external ear normal. There is no impacted cerumen.     Left Ear: Tympanic membrane, ear canal and external ear normal. There is no impacted cerumen.     Nose: Congestion and rhinorrhea present.     Comments: Patient mucosa is erythematous and edematous with clear discharge in both nares.    Mouth/Throat:     Mouth: Mucous membranes are moist.      Pharynx: Oropharynx is clear. No oropharyngeal exudate or posterior oropharyngeal erythema.  Cardiovascular:     Rate and Rhythm: Normal rate and regular rhythm.     Pulses: Normal pulses.     Heart sounds: Normal heart sounds. No murmur heard.    No friction rub. No gallop.  Pulmonary:     Effort: Pulmonary effort is normal.     Breath sounds: Wheezing and rhonchi present.     Comments: Patient has wheezes and rhonchi bilaterally. Musculoskeletal:     Cervical back: Normal range of motion and neck supple.  Lymphadenopathy:     Cervical: No cervical adenopathy.  Skin:    General: Skin is warm and dry.     Capillary Refill: Capillary refill takes less than 2 seconds.     Findings: No rash.  Neurological:     General: No focal deficit present.     Mental Status: He is alert and oriented to person, place, and time.      UC Treatments / Results  Labs (all labs ordered are listed, but only abnormal results are displayed) Labs Reviewed - No data to display  EKG   Radiology No results found.  Procedures Procedures (including critical care time)  Medications Ordered in UC Medications - No data to display  Initial Impression / Assessment and Plan / UC Course  I have reviewed the triage vital signs and the nursing notes.  Pertinent labs & imaging results that were available during my care of the patient were reviewed by me and considered in my medical decision making (see chart for details).   Patient is a pleasant, nontoxic-appearing 57 year old gentleman presenting for evaluation of 1 week with respiratory symptoms as outlined in  HPI above.  His physical exam does reveal upper respiratory congestion and clear rhinorrhea with clear postnasal drip.  Cardiopulmonary exam reveals wheezes and rhonchi in all lung fields diffusely.  He is able to speak in full sentence without dyspnea or tachypnea.  He is endorsing chills at home but no fever.  At triage his temperature is elevated at  99.1.  Blood pressure is also markedly elevated at 189/121.  I will have staff recheck that.  Additionally, I will order a chest x-ray to evaluate for any acute cardiopulmonary pathology.  Chest x-ray independently reviewed and evaluated by me.  Impression: There is a patchy perihilar infiltrate bilaterally with prominent lung markings.  Radiology read is pending.  I will discharge patient on the diagnosis of bronchitis on doxycycline 100 mg twice daily for 7 days.  Also I will prescribe an albuterol inhaler and spacer that he can take 1 to 2 puffs every 4-6 hours as needed for any shortness breath or wheezing.  Atrovent nasal spray to help with nasal congestion every 6 hours and Tessalon Perles and Promethazine DM cough syrup for cough and congestion.   Final Clinical Impressions(s) / UC Diagnoses   Final diagnoses:  Bronchitis     Discharge Instructions      Use the albuterol inhaler/nebulizer every 4-6 hours as needed for shortness of breath, wheezing, and cough.  Take the doxycycline twice daily with food for 7 days for treatment of bronchitis.  Use the Atrovent nasal spray, 2 squirts of each nostril every 6 hours, as needed for runny nose and nasal congestion.  Use the Tessalon Perles every 8 hours for your cough.  Taken with a small sip of water.  They may give you some numbness to the base of your tongue or metallic taste in your mouth, this is normal.  They are designed to calm down the cough reflex.  Use the Promethazine DM cough syrup at bedtime as will make you drowsy.  You may take 1 teaspoon (5 mL) every 6 hours.  Return for reevaluation for new or worsening symptoms.      ED Prescriptions     Medication Sig Dispense Auth. Provider   benzonatate (TESSALON) 100 MG capsule Take 2 capsules (200 mg total) by mouth every 8 (eight) hours. 21 capsule Becky Augusta, NP   albuterol (VENTOLIN HFA) 108 (90 Base) MCG/ACT inhaler Inhale 2 puffs into the lungs every 4 (four) hours  as needed. 18 g Becky Augusta, NP   Spacer/Aero-Holding Chambers (AEROCHAMBER MV) inhaler Use as instructed 1 each Becky Augusta, NP   ipratropium (ATROVENT) 0.06 % nasal spray Place 2 sprays into both nostrils 4 (four) times daily. 15 mL Becky Augusta, NP   doxycycline (VIBRAMYCIN) 100 MG capsule Take 1 capsule (100 mg total) by mouth 2 (two) times daily for 7 days. 14 capsule Becky Augusta, NP   promethazine-dextromethorphan (PROMETHAZINE-DM) 6.25-15 MG/5ML syrup Take 5 mLs by mouth 4 (four) times daily as needed. 118 mL Becky Augusta, NP      PDMP not reviewed this encounter.   Becky Augusta, NP 10/31/23 1807

## 2023-10-31 NOTE — Discharge Instructions (Addendum)
Use the albuterol inhaler/nebulizer every 4-6 hours as needed for shortness of breath, wheezing, and cough.  Take the doxycycline twice daily with food for 7 days for treatment of bronchitis.  Use the Atrovent nasal spray, 2 squirts of each nostril every 6 hours, as needed for runny nose and nasal congestion.  Use the Tessalon Perles every 8 hours for your cough.  Taken with a small sip of water.  They may give you some numbness to the base of your tongue or metallic taste in your mouth, this is normal.  They are designed to calm down the cough reflex.  Use the Promethazine DM cough syrup at bedtime as will make you drowsy.  You may take 1 teaspoon (5 mL) every 6 hours.  Return for reevaluation for new or worsening symptoms.

## 2023-11-15 ENCOUNTER — Other Ambulatory Visit: Payer: Self-pay

## 2023-11-15 NOTE — Telephone Encounter (Signed)
Please review. Are you going to take over this medication?  KP

## 2023-11-22 ENCOUNTER — Other Ambulatory Visit: Payer: Self-pay | Admitting: Physician Assistant

## 2023-11-22 MED ORDER — ERGOCALCIFEROL 1.25 MG (50000 UT) PO CAPS: 50000.0000 [IU] | ORAL_CAPSULE | ORAL | 3 refills | Status: DC

## 2023-11-22 MED ORDER — LISINOPRIL-HYDROCHLOROTHIAZIDE 20-25 MG PO TABS: 1.0000 | ORAL_TABLET | Freq: Every day | ORAL | 3 refills | Status: DC

## 2023-12-12 ENCOUNTER — Ambulatory Visit: Payer: BC Managed Care – PPO | Admitting: Internal Medicine

## 2023-12-13 ENCOUNTER — Ambulatory Visit: Payer: BC Managed Care – PPO | Admitting: Internal Medicine

## 2023-12-13 ENCOUNTER — Encounter: Payer: Self-pay | Admitting: Internal Medicine

## 2023-12-13 VITALS — BP 110/90 | HR 73 | Temp 98.4°F | Resp 16 | Ht 72.0 in | Wt 241.0 lb

## 2023-12-13 DIAGNOSIS — Z7189 Other specified counseling: Secondary | ICD-10-CM

## 2023-12-13 DIAGNOSIS — G4733 Obstructive sleep apnea (adult) (pediatric): Secondary | ICD-10-CM | POA: Diagnosis not present

## 2023-12-13 NOTE — Progress Notes (Signed)
 [facility] [address] [place], [place] [address]  Pulmonary Sleep Medicine   Office Visit Note  Patient Name: Eugene [name] DOB: [birth year] MRN [id number]  Date of Service: 12/13/2023  Complaints/HPI: he states he has been using the CPAP withou issues. He has noted consdensation in his mask. Not sure of what his room humidity is. He sleeps well. He notices difference if he misses the device Patient has no cough no congestion noted, denies having any fevers chills no sinus trouble  Office Spirometry Results:     ROS  General: (-) fever, (-) chills, (-) night sweats, (-) weakness Skin: (-) rashes, (-) itching,. Eyes: (-) visual changes, (-) redness, (-) itching. Nose and Sinuses: (-) nasal stuffiness or itchiness, (-) postnasal drip, (-) nosebleeds, (-) sinus trouble. Mouth and Throat: (-) sore throat, (-) hoarseness. Neck: (-) swollen glands, (-) enlarged thyroid , (-) neck pain. Respiratory: - cough, (-) bloody sputum, - shortness of breath, - wheezing. Cardiovascular: - ankle swelling, (-) chest pain. Lymphatic: (-) lymph node enlargement. Neurologic: (-) numbness, (-) tingling. Psychiatric: (-) anxiety, (-) depression   Current Medication: Outpatient Encounter Medications as of 12/13/2023  Medication Sig   albuterol  (VENTOLIN  HFA) 108 (90 Base) MCG/ACT inhaler Inhale 2 puffs into the lungs every 4 (four) hours as needed.   allopurinol (ZYLOPRIM) 300 MG tablet Take 300 mg by mouth daily.   benzonatate  (TESSALON ) 100 MG capsule Take 2 capsules (200 mg total) by mouth every 8 (eight) hours.   carvedilol  (COREG ) 12.5 MG tablet Take 12.5 mg by mouth 2 (two) times daily with a meal.   ergocalciferol  (VITAMIN D2) 1.25 MG (50000 UT) capsule Take 1 capsule (50,000 Units total) by mouth once a week.   finasteride (PROPECIA) 1 MG tablet Take 1 tablet by mouth daily.   fluticasone  (FLONASE ) 50 MCG/ACT nasal spray SPRAY 1 SPRAY INTO BOTH NOSTRILS DAILY.   furosemide  (LASIX) 20 MG tablet 20 mg daily.   gemfibrozil  (LOPID ) 600 MG tablet 2 (two) times daily before a meal.   hydrocortisone 2.5 % cream as needed.   ipratropium (ATROVENT ) 0.06 % nasal spray Place 2 sprays into both nostrils 4 (four) times daily.   levothyroxine  (SYNTHROID ) 75 MCG tablet Take 1 tablet (75 mcg total) by mouth daily before breakfast. Take on empty stomach, first thing in the morning, full glass of water, 30 min before anything else   lisinopril -hydrochlorothiazide  (ZESTORETIC ) 20-25 MG tablet Take 1 tablet by mouth daily.   minoxidil (LONITEN) 2.5 MG tablet Take 2.5 mg by mouth daily.   Multiple Vitamin (MULTIVITAMIN) capsule Take 1 capsule by mouth daily.   naproxen (EC NAPROSYN) 500 MG EC tablet as needed.   olopatadine (PATANOL) 0.1 % ophthalmic solution Place into both eyes as needed for allergies.   omega-3 acid ethyl esters (LOVAZA ) 1 g capsule Take 1 capsule by mouth 2 (two) times daily.   pravastatin  (PRAVACHOL ) 20 MG tablet Take 1 tablet (20 mg total) by mouth at bedtime.   promethazine -dextromethorphan (PROMETHAZINE -DM) 6.25-15 MG/5ML syrup Take 5 mLs by mouth 4 (four) times daily as needed.   sharps container 1 each by Does not apply route as needed.   SYRINGE/NEEDLE, DISP, 1 ML 25G X 5/8 1 ML MISC 1 each by Does not apply route every 14 (fourteen) days.   testosterone  cypionate (DEPOTESTOSTERONE CYPIONATE) 200 MG/ML injection Inject 1 mL (200 mg total) into the muscle every 14 (fourteen) days. Store at room temperature in a dark place   UNABLE TO FIND  TAKE TWO TABLETS BY MOUTH EVERY DAY   No facility-administered encounter medications on file as of 12/13/2023.    Surgical History: Past Surgical History:  Procedure Laterality Date   knee sugery Right    left elbow surgery     SHOULDER ARTHROSCOPY Right 08/21/2009   rotator cuff repair    Medical History: Past Medical History:  Diagnosis Date   Allergy    Arthritis    Edema    Gout    Hair loss     Hyperlipidemia    Hypertension    Sleep apnea    Thyroid  disease    Vitamin D  deficiency     Family History: Family History  Problem Relation Age of Onset   Hypertension Father    Alzheimer's disease Father    Sarcoidosis Sister        sarcoidosis lungs   Other Brother        brain damage    Social History: Social History   Socioeconomic History   Marital status: Married    Spouse name: Not on file   Number of children: Not on file   Years of education: Not on file   Highest education level: Not on file  Occupational History   Not on file  Tobacco Use   Smoking status: Former   Smokeless tobacco: Never  Vaping Use   Vaping status: Never Used  Substance and Sexual Activity   Alcohol use: Yes    Comment: beer 1-2 a week   Drug use: No   Sexual activity: Not on file  Other Topics Concern   Not on file  Social History Narrative   Not on file   Social Drivers of Health   Financial Resource Strain: Not on file  Food Insecurity: Not on file  Transportation Needs: Not on file  Physical Activity: Not on file  Stress: Not on file  Social Connections: Not on file  Intimate Partner Violence: Not on file    Vital Signs: Blood pressure (!) 110/90, pulse 73, temperature 98.4 F (36.9 C), resp. rate 16, height 6' (1.829 m), weight 241 lb (109.3 kg), SpO2 97%.  Examination: General Appearance: The patient is well-developed, well-nourished, and in no distress. Skin: Gross inspection of skin unremarkable. Head: normocephalic, no gross deformities. Eyes: no gross deformities noted. ENT: ears appear grossly normal no exudates. Neck: Supple. No thyromegaly. No LAD. Respiratory: no rhonchi noted. Cardiovascular: Normal S1 and S2 without murmur or rub. Extremities: No cyanosis. pulses are equal. Neurologic: Alert and oriented. No involuntary movements.  LABS: No results found for this or any previous visit (from the past 2160 hours).  Radiology: DG Chest 2  View Result Date: 10/31/2023 CLINICAL DATA:  Productive cough EXAM: CHEST - 2 VIEW COMPARISON:  06/21/2023 FINDINGS: The heart size and mediastinal contours are within normal limits. Both lungs are clear. Degenerative changes of the spine. Postsurgical changes of the right humeral head. IMPRESSION: No active cardiopulmonary disease. Electronically Signed   By: Luke Bun M.D.   On: 10/31/2023 18:38    No results found.  No results found.  Assessment and Plan: Patient Active Problem List   Diagnosis Date Noted   Alopecia 07/05/2023   GERD (gastroesophageal reflux disease) 07/05/2023   Chronic gout of right foot 07/05/2023   Mixed hyperlipidemia 07/05/2023   Erectile dysfunction 07/05/2023   Hypothyroidism 07/05/2023   Testosterone  deficiency in male 07/05/2023   History of vitamin D  deficiency 07/05/2023   Class 1 obesity with serious comorbidity and  body mass index (BMI) of 31.0 to 31.9 in adult 07/05/2023   Right thyroid  nodule 07/05/2023   Shifting sleep-work schedule 07/05/2023   Primary hypertension 07/04/2023   Abnormal gait 01/25/2022   Knee pain 01/25/2022   Rupture of quadriceps tendon 01/25/2022   OSA (obstructive sleep apnea) 07/10/2018   Rupture of tendon of biceps, long head 06/12/2018    1. OSA (obstructive sleep apnea) (Primary) Continue on PAP therapy Adjust the humidity on the machine   2. Obesity, morbid (HCC) Obesity Counseling: Had a lengthy discussion regarding patients BMI and weight issues. Patient was instructed on portion control as well as increased activity. Also discussed caloric restrictions with trying to maintain intake less than 2000 Kcal. Discussions were made in accordance with the 5As of weight management. Simple actions such as not eating late and if able to, taking a walk is suggested.   3. CPAP use counseling CPAP Counseling: had a lengthy discussion with the patient regarding the importance of PAP therapy in management of the sleep  apnea. Patient appears to understand the risk factor reduction and also understands the risks associated with untreated sleep apnea.  General Counseling: I have discussed the findings of the evaluation and examination with Lyn[place].  I have also discussed any further diagnostic evaluation thatmay be needed or ordered today. Kimm verbalizes understanding of the findings of todays visit. We also reviewed his medications today and discussed drug interactions and side effects including but not limited excessive drowsiness and altered mental states. We also discussed that there is always a risk not just to him but also people around him. he has been encouraged to call the office with any questions or concerns that should arise related to todays visit.  No orders of the defined types were placed in this encounter.    Time spent: 28  I have personally obtained a history, examined the patient, evaluated laboratory and imaging results, formulated the assessment and plan and placed orders.    Elfreda DELENA Bathe, MD [facility] Sleep medicine

## 2023-12-29 ENCOUNTER — Encounter: Payer: Self-pay | Admitting: Physician Assistant

## 2023-12-29 ENCOUNTER — Ambulatory Visit (INDEPENDENT_AMBULATORY_CARE_PROVIDER_SITE_OTHER): Payer: BC Managed Care – PPO | Admitting: Physician Assistant

## 2023-12-29 VITALS — BP 146/82 | HR 69 | Temp 98.6°F | Ht 72.0 in | Wt 242.0 lb

## 2023-12-29 DIAGNOSIS — M722 Plantar fascial fibromatosis: Secondary | ICD-10-CM

## 2023-12-29 DIAGNOSIS — I10 Essential (primary) hypertension: Secondary | ICD-10-CM | POA: Diagnosis not present

## 2023-12-29 DIAGNOSIS — Z23 Encounter for immunization: Secondary | ICD-10-CM | POA: Diagnosis not present

## 2023-12-29 DIAGNOSIS — E782 Mixed hyperlipidemia: Secondary | ICD-10-CM

## 2023-12-29 DIAGNOSIS — E291 Testicular hypofunction: Secondary | ICD-10-CM | POA: Diagnosis not present

## 2023-12-29 DIAGNOSIS — M1A0710 Idiopathic chronic gout, right ankle and foot, without tophus (tophi): Secondary | ICD-10-CM

## 2023-12-29 DIAGNOSIS — E039 Hypothyroidism, unspecified: Secondary | ICD-10-CM

## 2023-12-29 NOTE — Assessment & Plan Note (Signed)
Slightly elevated in clinic X 2.  Will hold off on any modification of pharmacotherapy at this time given reasonably well-controlled blood pressure at home and report of mild orthostasis.  Reminded patient that shift work is a risk factor for hypertension, and it would be ideal for health if he could work regular hours instead.

## 2023-12-29 NOTE — Patient Instructions (Signed)

## 2023-12-29 NOTE — Assessment & Plan Note (Signed)
Check testosterone, CBC, lipids.  Continue TRT at the prescribed dose

## 2023-12-29 NOTE — Assessment & Plan Note (Signed)
Check lipids, continue pravastatin which seems to be doing better for him.  Advised that while daily dosing would be optimal, he would also derive benefit from every other day dosing if necessary

## 2023-12-29 NOTE — Assessment & Plan Note (Signed)
Continue levothyroxine at the current dose, check TSH

## 2023-12-29 NOTE — Progress Notes (Signed)
Date:  12/29/2023   Name:  Eugene [name]   DOB:  June 17, 1966   MRN:  [id number]   Chief Complaint: Annual Exam  HPI Eugene [name] returns today for follow-up on chronic conditions including HTN, HLD, hypothyroidism, testosterone deficiency on TRT.  Overall feels he is doing quite well and does not need any medication refills at this time.  Last visit we switched from atorvastatin to pravastatin in hopes that it would lessen his myalgia, and he reports improvement in this problem though it has not completely resolved.  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   Blood pressure monitoring at home with systolic measurements right around 130 most of the time.  Occasionally has mild orthostatic symptoms on current medication.  Continues with testosterone 200 mg every 2 weeks, self-administered.  Purchases needles on [employer].  Still has 5 vials left, each containing 1 mL.   Just saw sleep pulmonologist Dr. Sherryll Burger on 12/13/2023 for their annual review.  Patient remains compliant with CPAP for OSA.   Medication list has been reviewed and updated.  Current Meds  Medication Sig   albuterol (VENTOLIN HFA) 108 (90 Base) MCG/ACT inhaler Inhale 2 puffs into the lungs every 4 (four) hours as needed.   allopurinol (ZYLOPRIM) 300 MG tablet Take 300 mg by mouth daily.   carvedilol (COREG) 12.5 MG tablet Take 12.5 mg by mouth 2 (two) times daily with a meal.   ergocalciferol (VITAMIN D2) 1.25 MG (50000 UT) capsule Take 1 capsule (50,000 Units total) by mouth once a week.   finasteride (PROPECIA) 1 MG tablet Take 1 tablet by mouth daily.   fluticasone (FLONASE) 50 MCG/ACT nasal spray SPRAY 1 SPRAY INTO BOTH NOSTRILS DAILY.   gemfibrozil (LOPID) 600 MG tablet 2 (two) times daily before a meal.   hydrocortisone 2.5 % cream as needed.   ipratropium (ATROVENT) 0.06 % nasal spray Place 2 sprays into both nostrils 4 (four) times daily.    levothyroxine (SYNTHROID) 75 MCG tablet Take 1 tablet (75 mcg total) by mouth daily before breakfast. Take on empty stomach, first thing in the morning, full glass of water, 30 min before anything else   lisinopril-hydrochlorothiazide (ZESTORETIC) 20-25 MG tablet Take 1 tablet by mouth daily.   minoxidil (LONITEN) 2.5 MG tablet Take 2.5 mg by mouth daily.   Multiple Vitamin (MULTIVITAMIN) capsule Take 1 capsule by mouth daily.   omega-3 acid ethyl esters (LOVAZA) 1 g capsule Take 1 capsule by mouth 2 (two) times daily.   pravastatin (PRAVACHOL) 20 MG tablet Take 1 tablet (20 mg total) by mouth at bedtime.   testosterone cypionate (DEPOTESTOSTERONE CYPIONATE) 200 MG/ML injection Inject 1 mL (200 mg total) into the muscle every 14 (fourteen) days. Store at room temperature in a dark place   [DISCONTINUED] benzonatate (TESSALON) 100 MG capsule Take 2 capsules (200 mg total) by mouth every 8 (eight) hours.   [DISCONTINUED] furosemide (LASIX) 20 MG tablet 20 mg daily.   [DISCONTINUED] naproxen (EC NAPROSYN) 500 MG EC tablet as needed.   [DISCONTINUED] olopatadine (PATANOL) 0.1 % ophthalmic solution Place into both eyes as needed for allergies.   [DISCONTINUED] SYRINGE/NEEDLE, DISP, 1 ML 25G X 5/8" 1 ML MISC 1 each by Does not apply route every 14 (fourteen) days.     Review of Systems  Patient Active Problem List   Diagnosis Date Noted   Alopecia 07/05/2023   GERD (gastroesophageal reflux disease) 07/05/2023  Chronic gout of right foot 07/05/2023   Mixed hyperlipidemia 07/05/2023   Erectile dysfunction 07/05/2023   Hypothyroidism 07/05/2023   Testosterone deficiency in male 07/05/2023   Vitamin D deficiency 07/05/2023   Class 1 obesity with serious comorbidity and body mass index (BMI) of 31.0 to 31.9 in adult 07/05/2023   Right thyroid nodule 07/05/2023   Shifting sleep-work schedule 07/05/2023   Primary hypertension 07/04/2023   Abnormal gait 01/25/2022   Knee pain 01/25/2022    Rupture of quadriceps tendon 01/25/2022   OSA (obstructive sleep apnea) 07/10/2018   Rupture of tendon of biceps, long head 06/12/2018    Allergies  Allergen Reactions   Other Other (See Comments)    Dog Dander. [employer].    Cat Dander     Other reaction(s): Other (See Comments) Cat/dog dander - watery eyes   Atorvastatin Other (See Comments)    myalgia    Immunization History  Administered Date(s) Administered   Influenza, Seasonal, Injecte, Preservative Fre 12/29/2023   Influenza-Unspecified 09/22/2022   Moderna Covid-19 Vaccine Bivalent Booster 3yrs & up 09/11/2021   Moderna Sars-Covid-2 Vaccination 11/21/2020, 04/24/2021   PFIZER(Purple Top)SARS-COV-2 Vaccination 02/27/2020, 03/19/2020   Pneumococcal Polysaccharide-23 10/12/2010, 01/14/2015   Td 10/12/2010, 03/30/2022   Tdap 03/30/2022   Zoster Recombinant(Shingrix) 09/19/2019, 11/27/2019    Past Surgical History:  Procedure Laterality Date   knee sugery Right    left elbow surgery     SHOULDER ARTHROSCOPY Right 08/21/2009   rotator cuff repair    Social History   Tobacco Use   Smoking status: Former   Smokeless tobacco: Never  Vaping Use   Vaping status: Never Used  Substance Use Topics   Alcohol use: Yes    Comment: beer 1-2 a week   Drug use: No    Family History  Problem Relation Age of Onset   Hypertension Father    Alzheimer's disease Father    Sarcoidosis Sister        sarcoidosis lungs   Other Brother        brain damage        12/29/2023    1:21 PM 07/04/2023    3:38 PM  GAD 7 : Generalized Anxiety Score  Nervous, Anxious, on Edge 0 0  Control/stop worrying 0 0  Worry too much - different things 0 0  Trouble relaxing 0 0  Restless 0 0  Easily annoyed or irritable 0 0  Afraid - awful might happen 0 0  Total GAD 7 Score 0 0  Anxiety Difficulty Not difficult at all Not difficult at all       12/29/2023    1:21 PM 07/04/2023    3:38 PM 07/10/2018    8:45 AM  Depression screen  PHQ 2/9  Decreased Interest 0 0 0  Down, Depressed, Hopeless 0 0 0  PHQ - 2 Score 0 0 0  Altered sleeping  0   Tired, decreased energy  2   Change in appetite  2   Feeling bad or failure about yourself   0   Trouble concentrating  0   Moving slowly or fidgety/restless  0   Suicidal thoughts  0   PHQ-9 Score  4   Difficult doing work/chores  Not difficult at all     BP Readings from Last 3 Encounters:  12/29/23 (!) 146/82  12/13/23 (!) 110/90  10/31/23 (!) 189/121    Wt Readings from Last 3 Encounters:  12/29/23 242 lb (109.8 kg)  12/13/23 241 lb (  109.3 kg)  10/31/23 230 lb (104.3 kg)    BP (!) 146/82 (BP Location: Left Arm, Patient Position: Sitting, Cuff Size: Large)   Pulse 69   Temp 98.6 F (37 C)   Ht 6' (1.829 m)   Wt 242 lb (109.8 kg)   SpO2 96%   BMI 32.82 kg/m   Physical Exam Vitals and nursing note reviewed.  Constitutional:      Appearance: Normal appearance.  Cardiovascular:     Rate and Rhythm: Normal rate and regular rhythm.     Heart sounds: No murmur heard.    No friction rub. No gallop.  Pulmonary:     Effort: Pulmonary effort is normal.     Breath sounds: Normal breath sounds.  Abdominal:     General: There is no distension.  Musculoskeletal:        General: Normal range of motion.  Feet:     Comments: Grossly normal appearance bilateral feet aside from perhaps mild bunion R 1st MTP vs gouty deformity. Mild TTP along mid-arch right foot.  Skin:    General: Skin is warm and dry.  Neurological:     Mental Status: He is alert and oriented to person, place, and time.     Gait: Gait is intact.  Psychiatric:        Mood and Affect: Mood and affect normal.     Recent Labs     Component Value Date/Time   NA 139 07/04/2023 1614   K 4.2 07/04/2023 1614   CL 100 07/04/2023 1614   CO2 22 07/04/2023 1614   GLUCOSE 89 07/04/2023 1614   BUN 10 07/04/2023 1614   CREATININE 0.91 07/04/2023 1614   CALCIUM 9.8 07/04/2023 1614   PROT 7.2  07/04/2023 1614   ALBUMIN 4.4 07/04/2023 1614   AST 24 07/04/2023 1614   ALT 25 07/04/2023 1614   ALKPHOS 93 07/04/2023 1614   BILITOT 0.3 07/04/2023 1614    Lab Results  Component Value Date   WBC 7.0 07/04/2023   HGB 15.8 07/04/2023   HCT 46.5 07/04/2023   MCV 91 07/04/2023   PLT 331 07/04/2023   No results found for: "HGBA1C" Lab Results  Component Value Date   CHOL 200 (H) 07/04/2023   HDL 31 (L) 07/04/2023   LDLCALC 120 (H) 07/04/2023   TRIG 280 (H) 07/04/2023   CHOLHDL 6.5 (H) 07/04/2023   Lab Results  Component Value Date   TSH 1.540 07/04/2023     Assessment and Plan:  Primary hypertension Assessment & Plan: Slightly elevated in clinic X 2.  Will hold off on any modification of pharmacotherapy at this time given reasonably well-controlled blood pressure at home and report of mild orthostasis.  Reminded patient that shift work is a risk factor for hypertension, and it would be ideal for health if he could work regular hours instead.  Orders: -     Lipid panel -     CBC with Differential/Platelet -     Comprehensive metabolic panel -     TSH  Testosterone deficiency in male Assessment & Plan: Check testosterone, CBC, lipids.  Continue TRT at the prescribed dose  Orders: -     Testosterone -     Lipid panel -     CBC with Differential/Platelet  Mixed hyperlipidemia Assessment & Plan: Check lipids, continue pravastatin which seems to be doing better for him.  Advised that while daily dosing would be optimal, he would also derive benefit from every other day  dosing if necessary  Orders: -     Lipid panel  Hypothyroidism, unspecified type Assessment & Plan: Continue levothyroxine at the current dose, check TSH  Orders: -     TSH  Chronic gout of right foot, unspecified cause Assessment & Plan: Check uric acid, nearly at goal last time  Orders: -     Uric acid  Plantar fasciitis of right foot -     Ambulatory referral to  Podiatry  Immunization due -     Flu vaccine trivalent PF, 6mos and older(Flulaval,Afluria,Fluarix,Fluzone)     Return in about 6 months (around 06/27/2024) for CPE.   Today's visit billed for provider time of 34 minutes including chart review, physical exam, specialist referral, and discussion of multiple chronic conditions.  Alvester Morin, PA-C, DMSc, Nutritionist [facility] [phone]

## 2023-12-29 NOTE — Assessment & Plan Note (Signed)
Check uric acid, nearly at goal last time

## 2023-12-30 LAB — COMPREHENSIVE METABOLIC PANEL
ALT: 44 [IU]/L (ref 0–44)
AST: 32 [IU]/L (ref 0–40)
Albumin: 4.5 g/dL (ref 3.8–4.9)
Alkaline Phosphatase: 72 [IU]/L (ref 44–121)
BUN/Creatinine Ratio: 18 (ref 9–20)
BUN: 19 mg/dL (ref 6–24)
Bilirubin Total: 0.5 mg/dL (ref 0.0–1.2)
CO2: 23 mmol/L (ref 20–29)
Calcium: 9.7 mg/dL (ref 8.7–10.2)
Chloride: 99 mmol/L (ref 96–106)
Creatinine, Ser: 1.06 mg/dL (ref 0.76–1.27)
Globulin, Total: 2.6 g/dL (ref 1.5–4.5)
Glucose: 102 mg/dL — ABNORMAL HIGH (ref 70–99)
Potassium: 4.1 mmol/L (ref 3.5–5.2)
Sodium: 137 mmol/L (ref 134–144)
Total Protein: 7.1 g/dL (ref 6.0–8.5)
eGFR: 81 mL/min/{1.73_m2} (ref >59)

## 2023-12-30 LAB — CBC WITH DIFFERENTIAL/PLATELET
Basophils Absolute: 0 10*3/uL (ref 0.0–0.2)
Basos: 0 %
EOS (ABSOLUTE): 0.1 10*3/uL (ref 0.0–0.4)
Eos: 2 %
Hematocrit: 48 % (ref 37.5–51.0)
Hemoglobin: 16.7 g/dL (ref 13.0–17.7)
Immature Grans (Abs): 0 10*3/uL (ref 0.0–0.1)
Immature Granulocytes: 1 %
Lymphocytes Absolute: 1.4 10*3/uL (ref 0.7–3.1)
Lymphs: 22 %
MCH: 32.4 pg (ref 26.6–33.0)
MCHC: 34.8 g/dL (ref 31.5–35.7)
MCV: 93 fL (ref 79–97)
Monocytes Absolute: 0.5 10*3/uL (ref 0.1–0.9)
Monocytes: 9 %
Neutrophils Absolute: 4.1 10*3/uL (ref 1.4–7.0)
Neutrophils: 66 %
Platelets: 201 10*3/uL (ref 150–450)
RBC: 5.15 x10E6/uL (ref 4.14–5.80)
RDW: 13.1 % (ref 11.6–15.4)
WBC: 6.2 10*3/uL (ref 3.4–10.8)

## 2023-12-30 LAB — LIPID PANEL
Chol/HDL Ratio: 5.6 {ratio} — ABNORMAL HIGH (ref 0.0–5.0)
Cholesterol, Total: 192 mg/dL (ref 100–199)
HDL: 34 mg/dL — ABNORMAL LOW (ref >39)
LDL Chol Calc (NIH): 99 mg/dL (ref 0–99)
Triglycerides: 349 mg/dL — ABNORMAL HIGH (ref 0–149)
VLDL Cholesterol Cal: 59 mg/dL — ABNORMAL HIGH (ref 5–40)

## 2023-12-30 LAB — TSH: TSH: 3.06 u[IU]/mL (ref 0.450–4.500)

## 2023-12-30 LAB — URIC ACID: Uric Acid: 6.1 mg/dL (ref 3.8–8.4)

## 2023-12-30 LAB — TESTOSTERONE: Testosterone: 602 ng/dL (ref 264–916)

## 2024-01-03 DIAGNOSIS — M955 Acquired deformity of pelvis: Secondary | ICD-10-CM | POA: Diagnosis not present

## 2024-01-03 DIAGNOSIS — M5137 Other intervertebral disc degeneration, lumbosacral region with discogenic back pain only: Secondary | ICD-10-CM | POA: Diagnosis not present

## 2024-01-03 DIAGNOSIS — M9903 Segmental and somatic dysfunction of lumbar region: Secondary | ICD-10-CM | POA: Diagnosis not present

## 2024-01-03 DIAGNOSIS — M9905 Segmental and somatic dysfunction of pelvic region: Secondary | ICD-10-CM | POA: Diagnosis not present

## 2024-01-19 ENCOUNTER — Encounter: Payer: Self-pay | Admitting: Podiatry

## 2024-01-19 ENCOUNTER — Ambulatory Visit: Payer: BC Managed Care – PPO | Admitting: Podiatry

## 2024-01-19 DIAGNOSIS — Q666 Other congenital valgus deformities of feet: Secondary | ICD-10-CM

## 2024-01-19 NOTE — Progress Notes (Signed)
 Subjective:  Patient ID: Eugene [name], male    DOB: 27-Aug-1966,  MRN: [id number]  Chief Complaint  Patient presents with   Foot Pain    "It's my right foot.  It's been hurting for a while.  It's my bunion and the bottom of my foot aches and burns.  Sometime it hurts on top."     58 y.o. male presents with the above complaint.  Patient presents with bilateral flatfoot deformity.  Patient states he is having some arch and heel pain to the right foot especially.  He is developing some bunions he denies any other acute complaints wanted get it evaluated does not wear any kind of orthotics is interested in obtaining them for his flatfoot deformity pain scale is 5 out of 10 dull aching nature.   Review of Systems: Negative except as noted in the HPI. Denies N/V/F/Ch.  Past Medical History:  Diagnosis Date   Allergy    Arthritis    Edema    Gout    Hair loss    Hyperlipidemia    Hypertension    Sleep apnea    Thyroid disease    Vitamin D deficiency     Current Outpatient Medications:    albuterol (VENTOLIN HFA) 108 (90 Base) MCG/ACT inhaler, Inhale 2 puffs into the lungs every 4 (four) hours as needed., Disp: 18 g, Rfl: 0   allopurinol (ZYLOPRIM) 300 MG tablet, Take 300 mg by mouth daily., Disp: , Rfl:    carvedilol (COREG) 12.5 MG tablet, Take 12.5 mg by mouth 2 (two) times daily with a meal., Disp: , Rfl:    ergocalciferol (VITAMIN D2) 1.25 MG (50000 UT) capsule, Take 1 capsule (50,000 Units total) by mouth once a week., Disp: 12 capsule, Rfl: 3   finasteride (PROPECIA) 1 MG tablet, Take 1 tablet by mouth daily., Disp: , Rfl:    fluticasone (FLONASE) 50 MCG/ACT nasal spray, SPRAY 1 SPRAY INTO BOTH NOSTRILS DAILY., Disp: 16 mL, Rfl: 2   gemfibrozil (LOPID) 600 MG tablet, 2 (two) times daily before a meal., Disp: , Rfl:    hydrocortisone 2.5 % cream, as needed., Disp: , Rfl:    ipratropium (ATROVENT) 0.06 % nasal spray, Place 2 sprays into both nostrils 4 (four) times daily.,  Disp: 15 mL, Rfl: 12   levothyroxine (SYNTHROID) 75 MCG tablet, Take 1 tablet (75 mcg total) by mouth daily before breakfast. Take on empty stomach, first thing in the morning, full glass of water, 30 min before anything else, Disp: 90 tablet, Rfl: 3   lisinopril-hydrochlorothiazide (ZESTORETIC) 20-25 MG tablet, Take 1 tablet by mouth daily., Disp: 90 tablet, Rfl: 3   minoxidil (LONITEN) 2.5 MG tablet, Take 2.5 mg by mouth daily., Disp: , Rfl:    Multiple Vitamin (MULTIVITAMIN) capsule, Take 1 capsule by mouth daily., Disp: , Rfl:    omega-3 acid ethyl esters (LOVAZA) 1 g capsule, Take 1 capsule by mouth 2 (two) times daily., Disp: , Rfl:    pravastatin (PRAVACHOL) 20 MG tablet, Take 1 tablet (20 mg total) by mouth at bedtime., Disp: 90 tablet, Rfl: 1   testosterone cypionate (DEPOTESTOSTERONE CYPIONATE) 200 MG/ML injection, Inject 1 mL (200 mg total) into the muscle every 14 (fourteen) days. Store at room temperature in a dark place, Disp: 10 mL, Rfl: 1  Social History   Tobacco Use  Smoking Status Former  Smokeless Tobacco Never    Allergies  Allergen Reactions   Other Other (See Comments)    Dog Dander. Carolin Coy  Droppings.    Cat Dander     Other reaction(s): Other (See Comments) Cat/dog dander - watery eyes   Atorvastatin Other (See Comments)    myalgia   Objective:  There were no vitals filed for this visit. There is no height or weight on file to calculate BMI. Constitutional Well developed. Well nourished.  Vascular Dorsalis pedis pulses palpable bilaterally. Posterior tibial pulses palpable bilaterally. Capillary refill normal to all digits.  No cyanosis or clubbing noted. Pedal hair growth normal.  Neurologic Normal speech. Oriented to person, place, and time. Epicritic sensation to light touch grossly present bilaterally.  Dermatologic Nails well groomed and normal in appearance. No open wounds. No skin lesions.  Orthopedic: Bilateral pes planovalgus deformity with  calcaneovalgus to many toe signs partially but recurred the arch with dorsiflexion of the hallux unable to perform single and double heel raise.   Radiographs: None Assessment:   1. Pes planovalgus    Plan:  Patient was evaluated and treated and all questions answered.  Pes planovalgus -I explained to patient the etiology of pes planovalgus and relationship with Planter fasciitis and various treatment options were discussed.  Given patient foot structure in the setting of Planter fasciitis I believe patient will benefit from custom-made orthotics to help control the hindfoot motion support the arch of the foot and take the stress away from plantar fascial.  Patient agrees with the plan like to proceed with orthotics -Patient was casted for orthotics  -  No follow-ups on file.

## 2024-01-24 DIAGNOSIS — M5137 Other intervertebral disc degeneration, lumbosacral region with discogenic back pain only: Secondary | ICD-10-CM | POA: Diagnosis not present

## 2024-01-24 DIAGNOSIS — M955 Acquired deformity of pelvis: Secondary | ICD-10-CM | POA: Diagnosis not present

## 2024-01-24 DIAGNOSIS — M9903 Segmental and somatic dysfunction of lumbar region: Secondary | ICD-10-CM | POA: Diagnosis not present

## 2024-01-24 DIAGNOSIS — M9905 Segmental and somatic dysfunction of pelvic region: Secondary | ICD-10-CM | POA: Diagnosis not present

## 2024-01-31 ENCOUNTER — Ambulatory Visit
Admission: EM | Admit: 2024-01-31 | Discharge: 2024-01-31 | Disposition: A | Discharge status: Home / Self Care | Payer: BC Managed Care – PPO | Attending: Physician Assistant | Admitting: Physician Assistant

## 2024-01-31 DIAGNOSIS — I10 Essential (primary) hypertension: Secondary | ICD-10-CM | POA: Diagnosis not present

## 2024-01-31 DIAGNOSIS — R051 Acute cough: Secondary | ICD-10-CM | POA: Diagnosis not present

## 2024-01-31 DIAGNOSIS — J208 Acute bronchitis due to other specified organisms: Secondary | ICD-10-CM | POA: Diagnosis not present

## 2024-01-31 DIAGNOSIS — R0981 Nasal congestion: Secondary | ICD-10-CM | POA: Diagnosis not present

## 2024-01-31 DIAGNOSIS — R5383 Other fatigue: Secondary | ICD-10-CM | POA: Diagnosis not present

## 2024-01-31 LAB — RESP PANEL BY RT-PCR (FLU A&B, COVID) ARPGX2
Influenza A by PCR: NEGATIVE
Influenza B by PCR: NEGATIVE
SARS Coronavirus 2 by RT PCR: NEGATIVE

## 2024-01-31 MED ORDER — PREDNISONE 50 MG PO TABS: 50.0000 mg | ORAL_TABLET | Freq: Every day | ORAL | 0 refills | Status: AC

## 2024-01-31 MED ORDER — PROMETHAZINE-DM 6.25-15 MG/5ML PO SYRP: 5.0000 mL | ORAL_SOLUTION | Freq: Four times a day (QID) | ORAL | 0 refills | Status: DC | PRN

## 2024-01-31 MED ORDER — ALBUTEROL SULFATE (2.5 MG/3ML) 0.083% IN NEBU
2.5000 mg | INHALATION_SOLUTION | Freq: Once | RESPIRATORY_TRACT | Status: AC
Start: 2024-01-31 — End: 2024-01-31
  Administered 2024-01-31: 2.5 mg via RESPIRATORY_TRACT

## 2024-01-31 MED ORDER — ALBUTEROL SULFATE HFA 108 (90 BASE) MCG/ACT IN AERS: 1.0000 | INHALATION_SPRAY | Freq: Four times a day (QID) | RESPIRATORY_TRACT | 0 refills | Status: AC | PRN

## 2024-01-31 NOTE — Discharge Instructions (Addendum)
-  Negative for flu and COVID -Viral bronchitis can last for up to 6 weeks but hopefully the medications helps -Increase rest and fluids -Return if fever >101 degrees, worsening cough or increased breathing trouble.

## 2024-01-31 NOTE — ED Triage Notes (Signed)
 Pt c/o nasal congestion, cough, sore throat x3days

## 2024-01-31 NOTE — ED Provider Notes (Signed)
 [facility]    CSN: [id number] Arrival date & time: 01/31/24  1504      History   Chief Complaint Chief Complaint  Patient presents with   Cough   Nasal Congestion    HPI Jerry Clyne Leone is a 58 y.o. male presenting for fatigue, body aches, headaches, chills, sore throat, cough, congestion, and shortness of breath, x 2-3 days. He denies known fever.  No chest pain, abdominal pain, nausea/vomiting or diarrhea.  Medical history significant for hypertension, sleep apnea, thyroid disease and arthritis.  He has not been taking any OTC meds other than zinc supplements.  No history of asthma, COPD and patient is a non-smoker.   HPI  Past Medical History:  Diagnosis Date   Allergy    Arthritis    Edema    Gout    Hair loss    Hyperlipidemia    Hypertension    Sleep apnea    Thyroid disease    Vitamin D deficiency     Patient Active Problem List   Diagnosis Date Noted   Alopecia 07/05/2023   GERD (gastroesophageal reflux disease) 07/05/2023   Chronic gout of right foot 07/05/2023   Mixed hyperlipidemia 07/05/2023   Erectile dysfunction 07/05/2023   Hypothyroidism 07/05/2023   Testosterone deficiency in male 07/05/2023   Vitamin D deficiency 07/05/2023   Class 1 obesity with serious comorbidity and body mass index (BMI) of 31.0 to 31.9 in adult 07/05/2023   Right thyroid nodule 07/05/2023   Shifting sleep-work schedule 07/05/2023   Primary hypertension 07/04/2023   Abnormal gait 01/25/2022   Knee pain 01/25/2022   Rupture of quadriceps tendon 01/25/2022   OSA (obstructive sleep apnea) 07/10/2018   Rupture of tendon of biceps, long head 06/12/2018    Past Surgical History:  Procedure Laterality Date   knee sugery Right    left elbow surgery     SHOULDER ARTHROSCOPY Right 08/21/2009   rotator cuff repair       Home Medications    Prior to Admission medications   Medication Sig Start Date End Date Taking? Authorizing Provider  albuterol  (VENTOLIN HFA) 108 (90 Base) MCG/ACT inhaler Inhale 1-2 puffs into the lungs every 6 (six) hours as needed for wheezing or shortness of breath. 01/31/24  Yes Shirlee Latch, PA-C  allopurinol (ZYLOPRIM) 300 MG tablet Take 300 mg by mouth daily.   Yes [provider]  carvedilol (COREG) 12.5 MG tablet Take 12.5 mg by mouth 2 (two) times daily with a meal.   Yes [provider]  ergocalciferol (VITAMIN D2) 1.25 MG (50000 UT) capsule Take 1 capsule (50,000 Units total) by mouth once a week. 11/22/23  Yes Remo Lipps, PA  finasteride (PROPECIA) 1 MG tablet Take 1 tablet by mouth daily.   Yes [provider]  fluticasone (FLONASE) 50 MCG/ACT nasal spray SPRAY 1 SPRAY INTO BOTH NOSTRILS DAILY. 09/06/23  Yes Remo Lipps, PA  gemfibrozil (LOPID) 600 MG tablet 2 (two) times daily before a meal.   Yes [provider]  hydrocortisone 2.5 % cream as needed.   Yes [provider]  ipratropium (ATROVENT) 0.06 % nasal spray Place 2 sprays into both nostrils 4 (four) times daily. 10/31/23  Yes Becky Augusta, NP  levothyroxine (SYNTHROID) 75 MCG tablet Take 1 tablet (75 mcg total) by mouth daily before breakfast. Take on empty stomach, first thing in the morning, full glass of water, 30 min before anything else 07/12/23  Yes Remo Lipps,  PA  lisinopril-hydrochlorothiazide (ZESTORETIC) 20-25 MG tablet Take 1 tablet by mouth daily. 11/22/23  Yes Remo Lipps, PA  minoxidil (LONITEN) 2.5 MG tablet Take 2.5 mg by mouth daily. 01/20/22  Yes [provider]  Multiple Vitamin (MULTIVITAMIN) capsule Take 1 capsule by mouth daily.   Yes [provider]  omega-3 acid ethyl esters (LOVAZA) 1 g capsule Take 1 capsule by mouth 2 (two) times daily. 03/31/23  Yes [provider]  pravastatin (PRAVACHOL) 20 MG tablet Take 1 tablet (20 mg total) by mouth at bedtime. 09/30/23  Yes Remo Lipps, PA  predniSONE (DELTASONE) 50 MG tablet Take 1  tablet (50 mg total) by mouth daily for 5 days. 01/31/24 02/05/24 Yes Shirlee Latch, PA-C  promethazine-dextromethorphan (PROMETHAZINE-DM) 6.25-15 MG/5ML syrup Take 5 mLs by mouth 4 (four) times daily as needed. 01/31/24  Yes Eusebio Friendly B, PA-C  testosterone cypionate (DEPOTESTOSTERONE CYPIONATE) 200 MG/ML injection Inject 1 mL (200 mg total) into the muscle every 14 (fourteen) days. Store at room temperature in a dark place 07/04/23  Yes Waddell, Melton Alar, PA    Family History Family History  Problem Relation Age of Onset   Hypertension Father    Alzheimer's disease Father    Sarcoidosis Sister        sarcoidosis lungs   Other Brother        brain damage    Social History Social History   Tobacco Use   Smoking status: Former   Smokeless tobacco: Never  Advertising account planner   Vaping status: Never Used  Substance Use Topics   Alcohol use: Yes    Comment: beer 1-2 a week   Drug use: No     Allergies   Other, Cat dander, and Atorvastatin   Review of Systems Review of Systems  Constitutional:  Positive for chills and fatigue. Negative for fever.  HENT:  Positive for congestion, rhinorrhea and sore throat. Negative for sinus pressure and sinus pain.   Respiratory:  Positive for cough and shortness of breath.   Cardiovascular:  Negative for chest pain.  Gastrointestinal:  Negative for abdominal pain, diarrhea, nausea and vomiting.  Musculoskeletal:  Positive for myalgias.  Neurological:  Negative for weakness, light-headedness and headaches.  Hematological:  Negative for adenopathy.     Physical Exam Triage Vital Signs ED Triage Vitals  Encounter Vitals Group     BP      Systolic BP Percentile      Diastolic BP Percentile      Pulse      Resp      Temp      Temp src      SpO2      Weight      Height      Head Circumference      Peak Flow      Pain Score      Pain Loc      Pain Education      Exclude from Growth Chart    No data found.  Updated Vital Signs BP  (!) 169/100 (BP Location: Right Arm)   Pulse 70   Temp 98.5 F (36.9 C) (Oral)   Ht 6' (1.829 m)   Wt 230 lb (104.3 kg)   SpO2 96%   BMI 31.19 kg/m   Physical Exam Vitals and nursing note reviewed.  Constitutional:      General: He is not in acute distress.    Appearance: Normal appearance. He is well-developed.  HENT:  Head: Normocephalic and atraumatic.     Nose: Congestion present.     Mouth/Throat:     Mouth: Mucous membranes are moist.     Pharynx: Oropharynx is clear. Posterior oropharyngeal erythema present.  Eyes:     General: No scleral icterus.    Conjunctiva/sclera: Conjunctivae normal.  Cardiovascular:     Rate and Rhythm: Normal rate and regular rhythm.     Heart sounds: Normal heart sounds.  Pulmonary:     Effort: Pulmonary effort is normal. No respiratory distress.     Breath sounds: Wheezing present. No rhonchi.  Musculoskeletal:     Cervical back: Neck supple.  Skin:    General: Skin is warm and dry.     Capillary Refill: Capillary refill takes less than 2 seconds.  Neurological:     General: No focal deficit present.     Mental Status: He is alert. Mental status is at baseline.     Motor: No weakness.     Gait: Gait normal.  Psychiatric:        Mood and Affect: Mood normal.        Behavior: Behavior normal.      UC Treatments / Results  Labs (all labs ordered are listed, but only abnormal results are displayed) Labs Reviewed  RESP PANEL BY RT-PCR (FLU A&B, COVID) ARPGX2    EKG   Radiology No results found.  Procedures Procedures (including critical care time)  Medications Ordered in UC Medications  albuterol (PROVENTIL) (2.5 MG/3ML) 0.083% nebulizer solution 2.5 mg (2.5 mg Nebulization Given 01/31/24 1641)    Initial Impression / Assessment and Plan / UC Course  I have reviewed the triage vital signs and the nursing notes.  Pertinent labs & imaging results that were available during my care of the patient were reviewed by me  and considered in my medical decision making (see chart for details).   58 year old male presents for fatigue, cough, congestion, sore throat, body aches, chills, and shortness of breath that began 2-3 days.  BP 179/101. Repeat is 169/100.  Patient has been advised to keep a log of his blood pressure at home and record it.  If consistently greater than 140 over 90s to follow-up with PCP.  Continue HCTZ for now.  He is afebrile.  Mildly ill-appearing but nontoxic.  On exam has nasal congestion, erythema posterior pharynx.  Wheezing bilateral upper lung fields.   Resp panel obtained.  Negative.  Reviewed result with patient.  Patient given albuterol nebulizer which was helpful for symptoms.  Symptoms consistent with viral bronchitis.  Supportive care encouraged with increasing rest and fluids.  Sent Promethazine DM, prednisone and albuterol inhaler to pharmacy.  Reviewed bronchitis can last for up to 6 weeks.  He should return if he has a fever, worsening cough or increased breathing difficulty.   Final Clinical Impressions(s) / UC Diagnoses   Final diagnoses:  Acute viral bronchitis  Acute cough  Nasal congestion  Essential hypertension  Other fatigue     Discharge Instructions      -Negative for flu and COVID -Viral bronchitis can last for up to 6 weeks but hopefully the medications helps -Increase rest and fluids -Return if fever >101 degrees, worsening cough or increased breathing trouble.      ED Prescriptions     Medication Sig Dispense Auth. Provider   predniSONE (DELTASONE) 50 MG tablet Take 1 tablet (50 mg total) by mouth daily for 5 days. 5 tablet Shirlee Latch, PA-C   promethazine-dextromethorphan (  PROMETHAZINE-DM) 6.25-15 MG/5ML syrup Take 5 mLs by mouth 4 (four) times daily as needed. 118 mL Eusebio Friendly B, PA-C   albuterol (VENTOLIN HFA) 108 (90 Base) MCG/ACT inhaler Inhale 1-2 puffs into the lungs every 6 (six) hours as needed for wheezing or shortness of  breath. 1 g Shirlee Latch, PA-C      PDMP not reviewed this encounter.      Shirlee Latch, PA-C 01/31/24 1705

## 2024-02-14 DIAGNOSIS — Z86018 Personal history of other benign neoplasm: Secondary | ICD-10-CM | POA: Diagnosis not present

## 2024-02-14 DIAGNOSIS — L578 Other skin changes due to chronic exposure to nonionizing radiation: Secondary | ICD-10-CM | POA: Diagnosis not present

## 2024-02-14 DIAGNOSIS — Z872 Personal history of diseases of the skin and subcutaneous tissue: Secondary | ICD-10-CM | POA: Diagnosis not present

## 2024-02-14 DIAGNOSIS — Z85828 Personal history of other malignant neoplasm of skin: Secondary | ICD-10-CM | POA: Diagnosis not present

## 2024-02-16 ENCOUNTER — Telehealth: Payer: Self-pay

## 2024-02-16 NOTE — Telephone Encounter (Signed)
 LEFT VM TO SCHEDULE ORTHOTIC PICK UP, WILL BE SENDING ORTHOTICS TO [facility]

## 2024-02-22 ENCOUNTER — Other Ambulatory Visit: Payer: Self-pay | Admitting: Physician Assistant

## 2024-02-22 MED ORDER — OMEGA-3-ACID ETHYL ESTERS 1 G PO CAPS: 1.0000 | ORAL_CAPSULE | Freq: Two times a day (BID) | ORAL | 3 refills | Status: DC

## 2024-03-21 ENCOUNTER — Other Ambulatory Visit: Payer: Self-pay | Admitting: Physician Assistant

## 2024-03-21 DIAGNOSIS — E291 Testicular hypofunction: Secondary | ICD-10-CM

## 2024-03-21 MED ORDER — TESTOSTERONE CYPIONATE 200 MG/ML IM SOLN: 200.0000 mg | INTRAMUSCULAR | 1 refills | Status: DC

## 2024-03-21 NOTE — Telephone Encounter (Signed)
 Please review and advise.   JM

## 2024-04-02 ENCOUNTER — Other Ambulatory Visit: Payer: Self-pay | Admitting: Physician Assistant

## 2024-04-02 ENCOUNTER — Other Ambulatory Visit

## 2024-04-02 DIAGNOSIS — E785 Hyperlipidemia, unspecified: Secondary | ICD-10-CM

## 2024-04-03 NOTE — Telephone Encounter (Signed)
 Last OV 12/29/23 Requested Prescriptions  Pending Prescriptions Disp Refills   pravastatin  (PRAVACHOL ) 20 MG tablet [Pharmacy Med Name: PRAVASTATIN  SODIUM 20 MG TAB] 90 tablet 1    Sig: TAKE 1 TABLET BY MOUTH EVERYDAY AT BEDTIME     Cardiovascular:  Antilipid - Statins Failed - 04/03/2024  3:10 PM      Failed - Valid encounter within last 12 months    Recent Outpatient Visits   None     Future Appointments             In 2 months Larkin Plumb, [name], PA [facility] Health Primary Care & Sports Medicine at [facility], [facility]            Failed - Lipid Panel in normal range within the last 12 months    Cholesterol, Total  Date Value Ref Range Status  12/29/2023 192 100 - 199 mg/dL Final   LDL Chol Calc (NIH)  Date Value Ref Range Status  12/29/2023 99 0 - 99 mg/dL Final   HDL  Date Value Ref Range Status  12/29/2023 34 (L) >39 mg/dL Final   Triglycerides  Date Value Ref Range Status  12/29/2023 349 (H) 0 - 149 mg/dL Final         Passed - Patient is not pregnant

## 2024-04-10 ENCOUNTER — Ambulatory Visit (INDEPENDENT_AMBULATORY_CARE_PROVIDER_SITE_OTHER): Admitting: Podiatry

## 2024-04-10 DIAGNOSIS — Q666 Other congenital valgus deformities of feet: Secondary | ICD-10-CM

## 2024-04-10 NOTE — Progress Notes (Signed)
 Orthotics are dispensed and functioning well no acute complaints feeling well.

## 2024-04-16 DIAGNOSIS — M79605 Pain in left leg: Secondary | ICD-10-CM | POA: Diagnosis not present

## 2024-04-16 DIAGNOSIS — I10 Essential (primary) hypertension: Secondary | ICD-10-CM | POA: Diagnosis not present

## 2024-04-16 DIAGNOSIS — M25562 Pain in left knee: Secondary | ICD-10-CM | POA: Diagnosis not present

## 2024-04-16 DIAGNOSIS — S86812A Strain of other muscle(s) and tendon(s) at lower leg level, left leg, initial encounter: Secondary | ICD-10-CM | POA: Diagnosis not present

## 2024-04-16 DIAGNOSIS — S76101A Unspecified injury of right quadriceps muscle, fascia and tendon, initial encounter: Secondary | ICD-10-CM | POA: Diagnosis not present

## 2024-04-16 DIAGNOSIS — E7800 Pure hypercholesterolemia, unspecified: Secondary | ICD-10-CM | POA: Diagnosis not present

## 2024-04-16 DIAGNOSIS — S76102A Unspecified injury of left quadriceps muscle, fascia and tendon, initial encounter: Secondary | ICD-10-CM | POA: Diagnosis not present

## 2024-04-16 DIAGNOSIS — Z888 Allergy status to other drugs, medicaments and biological substances status: Secondary | ICD-10-CM | POA: Diagnosis not present

## 2024-04-16 DIAGNOSIS — Z9109 Other allergy status, other than to drugs and biological substances: Secondary | ICD-10-CM | POA: Diagnosis not present

## 2024-04-16 DIAGNOSIS — W101XXA Fall (on)(from) sidewalk curb, initial encounter: Secondary | ICD-10-CM | POA: Diagnosis not present

## 2024-04-17 DIAGNOSIS — M25562 Pain in left knee: Secondary | ICD-10-CM | POA: Diagnosis not present

## 2024-04-18 DIAGNOSIS — M25562 Pain in left knee: Secondary | ICD-10-CM | POA: Diagnosis not present

## 2024-04-19 ENCOUNTER — Telehealth: Payer: Self-pay | Admitting: Internal Medicine

## 2024-04-19 NOTE — Telephone Encounter (Signed)
 Per request, cpap supply order completed & faxed to [facility]. Scanned-Toni

## 2024-04-20 DIAGNOSIS — S76112A Strain of left quadriceps muscle, fascia and tendon, initial encounter: Secondary | ICD-10-CM | POA: Diagnosis not present

## 2024-04-24 DIAGNOSIS — S76102A Unspecified injury of left quadriceps muscle, fascia and tendon, initial encounter: Secondary | ICD-10-CM | POA: Diagnosis not present

## 2024-04-24 DIAGNOSIS — S76112A Strain of left quadriceps muscle, fascia and tendon, initial encounter: Secondary | ICD-10-CM | POA: Diagnosis not present

## 2024-04-24 DIAGNOSIS — M94262 Chondromalacia, left knee: Secondary | ICD-10-CM | POA: Diagnosis not present

## 2024-04-24 DIAGNOSIS — M12862 Other specific arthropathies, not elsewhere classified, left knee: Secondary | ICD-10-CM | POA: Diagnosis not present

## 2024-05-08 ENCOUNTER — Other Ambulatory Visit: Payer: Self-pay | Admitting: Physician Assistant

## 2024-05-08 DIAGNOSIS — E039 Hypothyroidism, unspecified: Secondary | ICD-10-CM

## 2024-05-08 NOTE — Telephone Encounter (Signed)
 Requested Prescriptions  Refused Prescriptions Disp Refills   SYNTHROID  75 MCG tablet [Pharmacy Med Name: SYNTHROID     TAB 75MCG] 90 tablet 3    Sig: TAKE 1 TABLET DAILY BEFORE BREAKFAST. TAKE ON EMPTY STOMACH, FIRST THING IN THE MORNING, FULL GLASS OF WATER, 30 MIN BEFORE ANYTHING ELSE FOR HYPOTHYROIDISM     Endocrinology:  Hypothyroid Agents Failed - 05/08/2024  5:45 PM      Failed - Valid encounter within last 12 months    Recent Outpatient Visits   None     Future Appointments             In 1 month Waddell, [name], PA [facility] Primary Care & Sports Medicine at [facility]            Passed - TSH in normal range and within 360 days    TSH  Date Value Ref Range Status  12/29/2023 3.060 0.450 - 4.500 uIU/mL Final

## 2024-05-09 DIAGNOSIS — M25562 Pain in left knee: Secondary | ICD-10-CM | POA: Diagnosis not present

## 2024-05-14 DIAGNOSIS — M25562 Pain in left knee: Secondary | ICD-10-CM | POA: Diagnosis not present

## 2024-05-14 DIAGNOSIS — S76112A Strain of left quadriceps muscle, fascia and tendon, initial encounter: Secondary | ICD-10-CM | POA: Diagnosis not present

## 2024-05-14 DIAGNOSIS — G8918 Other acute postprocedural pain: Secondary | ICD-10-CM | POA: Diagnosis not present

## 2024-05-24 DIAGNOSIS — Z4889 Encounter for other specified surgical aftercare: Secondary | ICD-10-CM | POA: Diagnosis not present

## 2024-05-28 DIAGNOSIS — M25562 Pain in left knee: Secondary | ICD-10-CM | POA: Diagnosis not present

## 2024-05-28 DIAGNOSIS — R600 Localized edema: Secondary | ICD-10-CM | POA: Diagnosis not present

## 2024-06-01 DIAGNOSIS — M25562 Pain in left knee: Secondary | ICD-10-CM | POA: Diagnosis not present

## 2024-06-01 DIAGNOSIS — R600 Localized edema: Secondary | ICD-10-CM | POA: Diagnosis not present

## 2024-06-04 ENCOUNTER — Encounter: Payer: Self-pay | Admitting: Internal Medicine

## 2024-06-04 ENCOUNTER — Ambulatory Visit: Admitting: Internal Medicine

## 2024-06-04 VITALS — BP 168/92 | HR 87 | Temp 97.9°F | Resp 16 | Ht 72.0 in | Wt 230.0 lb

## 2024-06-04 DIAGNOSIS — G4733 Obstructive sleep apnea (adult) (pediatric): Secondary | ICD-10-CM

## 2024-06-04 DIAGNOSIS — Z7189 Other specified counseling: Secondary | ICD-10-CM

## 2024-06-04 NOTE — Progress Notes (Signed)
 [facility] [address] [place], [place] [address]  Pulmonary Sleep Medicine   Office Visit Note  Patient Name: Eugene [name] DOB: August 08, 1966 MRN [id number]  Date of Service: 06/04/2024  Complaints/HPI: Patient had a ruptured quad and has had 2 surgeries for the problem. He states he is in the splint right now. Patient states he has been taking OTC pain meds. As far as the sleep is concerned he has not been using the mask due to all the orhto issues going on. He will get a download for compliance.  Office Spirometry Results:     ROS  General: (-) fever, (-) chills, (-) night sweats, (-) weakness Skin: (-) rashes, (-) itching,. Eyes: (-) visual changes, (-) redness, (-) itching. Nose and Sinuses: (-) nasal stuffiness or itchiness, (-) postnasal drip, (-) nosebleeds, (-) sinus trouble. Mouth and Throat: (-) sore throat, (-) hoarseness. Neck: (-) swollen glands, (-) enlarged thyroid, (-) neck pain. Respiratory: - cough, (-) bloody sputum, - shortness of breath, - wheezing. Cardiovascular: + ankle swelling, (-) chest pain. Lymphatic: (-) lymph node enlargement. Neurologic: (-) numbness, (-) tingling. Psychiatric: (-) anxiety, (-) depression   Current Medication: Outpatient Encounter Medications as of 06/04/2024  Medication Sig   albuterol  (VENTOLIN  HFA) 108 (90 Base) MCG/ACT inhaler Inhale 1-2 puffs into the lungs every 6 (six) hours as needed for wheezing or shortness of breath.   allopurinol (ZYLOPRIM) 300 MG tablet Take 300 mg by mouth daily.   carvedilol (COREG) 12.5 MG tablet Take 12.5 mg by mouth 2 (two) times daily with a meal.   ergocalciferol  (VITAMIN D2) 1.25 MG (50000 UT) capsule Take 1 capsule (50,000 Units total) by mouth once a week.   finasteride (PROPECIA) 1 MG tablet Take 1 tablet by mouth daily.   fluticasone  (FLONASE ) 50 MCG/ACT nasal spray SPRAY 1 SPRAY INTO BOTH NOSTRILS DAILY.   gemfibrozil (LOPID) 600 MG tablet 2 (two) times daily before  a meal.   hydrocortisone 2.5 % cream as needed.   levothyroxine  (SYNTHROID ) 75 MCG tablet Take 1 tablet (75 mcg total) by mouth daily before breakfast. Take on empty stomach, first thing in the morning, full glass of water, 30 min before anything else   lisinopril -hydrochlorothiazide  (ZESTORETIC ) 20-25 MG tablet Take 1 tablet by mouth daily.   minoxidil (LONITEN) 2.5 MG tablet Take 2.5 mg by mouth daily.   Multiple Vitamin (MULTIVITAMIN) capsule Take 1 capsule by mouth daily.   pravastatin  (PRAVACHOL ) 20 MG tablet TAKE 1 TABLET BY MOUTH EVERYDAY AT BEDTIME   testosterone  cypionate (DEPOTESTOSTERONE CYPIONATE) 200 MG/ML injection Inject 1 mL (200 mg total) into the muscle every 14 (fourteen) days. Store at room temperature in a dark place   ipratropium (ATROVENT ) 0.06 % nasal spray Place 2 sprays into both nostrils 4 (four) times daily.   omega-3 acid ethyl esters (LOVAZA ) 1 g capsule Take 1 capsule (1 g total) by mouth 2 (two) times daily.   promethazine -dextromethorphan (PROMETHAZINE -DM) 6.25-15 MG/5ML syrup Take 5 mLs by mouth 4 (four) times daily as needed.   No facility-administered encounter medications on file as of 06/04/2024.    Surgical History: Past Surgical History:  Procedure Laterality Date   knee sugery Right    left elbow surgery     SHOULDER ARTHROSCOPY Right 08/21/2009   rotator cuff repair    Medical History: Past Medical History:  Diagnosis Date   Allergy    Arthritis    Edema    Gout    Hair loss  Hyperlipidemia    Hypertension    Sleep apnea    Thyroid disease    Vitamin D  deficiency     Family History: Family History  Problem Relation Age of Onset   Hypertension Father    Alzheimer's disease Father    Sarcoidosis Sister        sarcoidosis lungs   Other Brother        brain damage    Social History: Social History   Socioeconomic History   Marital status: Married    Spouse name: Not on file   Number of children: Not on file   Years of  education: Not on file   Highest education level: Not on file  Occupational History   Not on file  Tobacco Use   Smoking status: Former   Smokeless tobacco: Never  Vaping Use   Vaping status: Never Used  Substance and Sexual Activity   Alcohol use: Yes    Comment: beer 1-2 a week   Drug use: No   Sexual activity: Not on file  Other Topics Concern   Not on file  Social History Narrative   Not on file   Social Drivers of Health   Financial Resource Strain: Not on file  Food Insecurity: No Food Insecurity (12/29/2023)   Hunger Vital Sign    Worried About Running Out of Food in the Last Year: Never true    Ran Out of Food in the Last Year: Never true  Transportation Needs: No Transportation Needs (12/29/2023)   PRAPARE - Administrator, Civil Service (Medical): No    Lack of Transportation (Non-Medical): No  Physical Activity: Not on file  Stress: Not on file  Social Connections: Not on file  Intimate Partner Violence: Not At Risk (12/29/2023)   Humiliation, Afraid, Rape, and Kick questionnaire    Fear of Current or Ex-Partner: No    Emotionally Abused: No    Physically Abused: No    Sexually Abused: No    Vital Signs: Blood pressure (!) 168/92, pulse 87, temperature 97.9 F (36.6 C), resp. rate 16, height 6' (1.829 m), weight 230 lb (104.3 kg), SpO2 98%.  Examination: General Appearance: The patient is well-developed, well-nourished, and in no distress. Skin: Gross inspection of skin unremarkable. Head: normocephalic, no gross deformities. Eyes: no gross deformities noted. ENT: ears appear grossly normal no exudates. Neck: Supple. No thyromegaly. No LAD. Respiratory: no rhonchi noted. Cardiovascular: Normal S1 and S2 without murmur or rub. Extremities: No cyanosis. pulses are equal. Neurologic: Alert and oriented. No involuntary movements.  LABS: No results found for this or any previous visit (from the past 2160 hours).  Radiology: No results  found.  No results found.  No results found.  Assessment and Plan: Patient Active Problem List   Diagnosis Date Noted   Alopecia 07/05/2023   GERD (gastroesophageal reflux disease) 07/05/2023   Chronic gout of right foot 07/05/2023   Mixed hyperlipidemia 07/05/2023   Erectile dysfunction 07/05/2023   Hypothyroidism 07/05/2023   Testosterone  deficiency in male 07/05/2023   Vitamin D  deficiency 07/05/2023   Class 1 obesity with serious comorbidity and body mass index (BMI) of 31.0 to 31.9 in adult 07/05/2023   Right thyroid nodule 07/05/2023   Shifting sleep-work schedule 07/05/2023   Primary hypertension 07/04/2023   Abnormal gait 01/25/2022   Knee pain 01/25/2022   Rupture of quadriceps tendon 01/25/2022   OSA (obstructive sleep apnea) 07/10/2018   Rupture of tendon of biceps, long head 06/12/2018  1. OSA (obstructive sleep apnea) (Primary) I suspect that his sleep apnea might be a little bit worse he has gained significant amount of weight in addition he is not currently using the positive airway pressure due to the surgery issues.  2. Obesity, morbid (HCC) Needs to get back to the gym and try to work on losing weight diet and exercise.  3. CPAP use counseling Compliance with CPAP discussed   General Counseling: I have discussed the findings of the evaluation and examination with [place].  I have also discussed any further diagnostic evaluation thatmay be needed or ordered today. Escher verbalizes understanding of the findings of todays visit. We also reviewed his medications today and discussed drug interactions and side effects including but not limited excessive drowsiness and altered mental states. We also discussed that there is always a risk not just to him but also people around him. he has been encouraged to call the office with any questions or concerns that should arise related to todays visit.  No orders of the defined types were placed in this encounter.    Time  spent: 27  I have personally obtained a history, examined the patient, evaluated laboratory and imaging results, formulated the assessment and plan and placed orders.    Elfreda DELENA Bathe, MD [facility] Pulmonary and Critical Care Sleep medicine

## 2024-06-04 NOTE — Patient Instructions (Signed)

## 2024-06-05 DIAGNOSIS — R600 Localized edema: Secondary | ICD-10-CM | POA: Diagnosis not present

## 2024-06-05 DIAGNOSIS — M25562 Pain in left knee: Secondary | ICD-10-CM | POA: Diagnosis not present

## 2024-06-07 DIAGNOSIS — M25562 Pain in left knee: Secondary | ICD-10-CM | POA: Diagnosis not present

## 2024-06-07 DIAGNOSIS — R600 Localized edema: Secondary | ICD-10-CM | POA: Diagnosis not present

## 2024-06-11 DIAGNOSIS — R600 Localized edema: Secondary | ICD-10-CM | POA: Diagnosis not present

## 2024-06-11 DIAGNOSIS — M25562 Pain in left knee: Secondary | ICD-10-CM | POA: Diagnosis not present

## 2024-06-15 DIAGNOSIS — M25562 Pain in left knee: Secondary | ICD-10-CM | POA: Diagnosis not present

## 2024-06-15 DIAGNOSIS — R600 Localized edema: Secondary | ICD-10-CM | POA: Diagnosis not present

## 2024-06-18 DIAGNOSIS — M25562 Pain in left knee: Secondary | ICD-10-CM | POA: Diagnosis not present

## 2024-06-18 DIAGNOSIS — R600 Localized edema: Secondary | ICD-10-CM | POA: Diagnosis not present

## 2024-06-22 DIAGNOSIS — R600 Localized edema: Secondary | ICD-10-CM | POA: Diagnosis not present

## 2024-06-22 DIAGNOSIS — M25562 Pain in left knee: Secondary | ICD-10-CM | POA: Diagnosis not present

## 2024-06-27 ENCOUNTER — Encounter: Payer: BC Managed Care – PPO | Admitting: Physician Assistant

## 2024-06-28 DIAGNOSIS — S76112A Strain of left quadriceps muscle, fascia and tendon, initial encounter: Secondary | ICD-10-CM | POA: Diagnosis not present

## 2024-06-28 DIAGNOSIS — Z471 Aftercare following joint replacement surgery: Secondary | ICD-10-CM | POA: Diagnosis not present

## 2024-06-28 DIAGNOSIS — S76122S Laceration of left quadriceps muscle, fascia and tendon, sequela: Secondary | ICD-10-CM | POA: Diagnosis not present

## 2024-06-28 DIAGNOSIS — Z4733 Aftercare following explantation of knee joint prosthesis: Secondary | ICD-10-CM | POA: Diagnosis not present

## 2024-06-29 DIAGNOSIS — R600 Localized edema: Secondary | ICD-10-CM | POA: Diagnosis not present

## 2024-06-29 DIAGNOSIS — M25562 Pain in left knee: Secondary | ICD-10-CM | POA: Diagnosis not present

## 2024-07-02 DIAGNOSIS — M25562 Pain in left knee: Secondary | ICD-10-CM | POA: Diagnosis not present

## 2024-07-02 DIAGNOSIS — R600 Localized edema: Secondary | ICD-10-CM | POA: Diagnosis not present

## 2024-07-05 ENCOUNTER — Other Ambulatory Visit: Payer: Self-pay | Admitting: Physician Assistant

## 2024-07-05 DIAGNOSIS — E785 Hyperlipidemia, unspecified: Secondary | ICD-10-CM

## 2024-07-05 NOTE — Telephone Encounter (Signed)
 Requested Prescriptions  Pending Prescriptions Disp Refills   pravastatin  (PRAVACHOL ) 20 MG tablet [Pharmacy Med Name: PRAVASTATIN  SODIUM 20 MG TAB] 90 tablet 0    Sig: TAKE 1 TABLET BY MOUTH EVERYDAY AT BEDTIME     Cardiovascular:  Antilipid - Statins Failed - 07/05/2024  3:48 PM      Failed - Valid encounter within last 12 months    Recent Outpatient Visits   None     Future Appointments             In 2 weeks Manya Toribio SQUIBB, PA [facility] Health Primary Care & Sports Medicine at [facility]            Failed - Lipid Panel in normal range within the last 12 months    Cholesterol, Total  Date Value Ref Range Status  12/29/2023 192 100 - 199 mg/dL Final   LDL Chol Calc (NIH)  Date Value Ref Range Status  12/29/2023 99 0 - 99 mg/dL Final   HDL  Date Value Ref Range Status  12/29/2023 34 (L) >39 mg/dL Final   Triglycerides  Date Value Ref Range Status  12/29/2023 349 (H) 0 - 149 mg/dL Final         Passed - Patient is not pregnant

## 2024-07-18 DIAGNOSIS — M25562 Pain in left knee: Secondary | ICD-10-CM | POA: Diagnosis not present

## 2024-07-18 DIAGNOSIS — R600 Localized edema: Secondary | ICD-10-CM | POA: Diagnosis not present

## 2024-07-24 DIAGNOSIS — R600 Localized edema: Secondary | ICD-10-CM | POA: Diagnosis not present

## 2024-07-24 DIAGNOSIS — M25562 Pain in left knee: Secondary | ICD-10-CM | POA: Diagnosis not present

## 2024-07-25 ENCOUNTER — Ambulatory Visit (INDEPENDENT_AMBULATORY_CARE_PROVIDER_SITE_OTHER): Admitting: Physician Assistant

## 2024-07-25 VITALS — BP 180/98 | HR 81 | Temp 99.1°F | Ht 72.0 in | Wt 248.0 lb

## 2024-07-25 DIAGNOSIS — Z0000 Encounter for general adult medical examination without abnormal findings: Secondary | ICD-10-CM

## 2024-07-25 DIAGNOSIS — I10 Essential (primary) hypertension: Secondary | ICD-10-CM | POA: Diagnosis not present

## 2024-07-25 DIAGNOSIS — Z79890 Hormone replacement therapy (postmenopausal): Secondary | ICD-10-CM | POA: Diagnosis not present

## 2024-07-25 DIAGNOSIS — E782 Mixed hyperlipidemia: Secondary | ICD-10-CM | POA: Diagnosis not present

## 2024-07-25 DIAGNOSIS — M109 Gout, unspecified: Secondary | ICD-10-CM

## 2024-07-25 DIAGNOSIS — Z125 Encounter for screening for malignant neoplasm of prostate: Secondary | ICD-10-CM

## 2024-07-25 MED ORDER — EZETIMIBE 10 MG PO TABS: 10.0000 mg | ORAL_TABLET | Freq: Every day | ORAL | 1 refills | Status: AC

## 2024-07-25 MED ORDER — CARVEDILOL 12.5 MG PO TABS: 12.5000 mg | ORAL_TABLET | Freq: Two times a day (BID) | ORAL | 1 refills | Status: DC

## 2024-07-25 NOTE — Progress Notes (Signed)
 Date:  07/25/2024   Name:  Eugene [name]   DOB:  17-Oct-1966   MRN:  [id number]   Chief Complaint: Annual Exam  HPI [name] returns for routine physical exam today.  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  As a result, he has been less active and has gained about 18 pounds since his last visit with me.   Last Physical: >1y ago Last Dental Exam: 1wk ago Last Eye Exam: 76mo ago Last CRC screen: Cologuard neg 04/19/22 Last PSA: 0.6 on 07/04/23 Immunizations Due: none    Medication list has been reviewed and updated.  Current Meds  Medication Sig   albuterol  (VENTOLIN  HFA) 108 (90 Base) MCG/ACT inhaler Inhale 1-2 puffs into the lungs every 6 (six) hours as needed for wheezing or shortness of breath.   allopurinol (ZYLOPRIM) 300 MG tablet Take 300 mg by mouth daily.   carvedilol  (COREG ) 12.5 MG tablet Take 12.5 mg by mouth 2 (two) times daily with a meal.   ergocalciferol  (VITAMIN D2) 1.25 MG (50000 UT) capsule Take 1 capsule (50,000 Units total) by mouth once a week.   finasteride (PROPECIA) 1 MG tablet Take 1 tablet by mouth daily.   fluticasone  (FLONASE ) 50 MCG/ACT nasal spray SPRAY 1 SPRAY INTO BOTH NOSTRILS DAILY.   gemfibrozil (LOPID) 600 MG tablet 2 (two) times daily before a meal.   hydrocortisone 2.5 % cream as needed.   levothyroxine  (SYNTHROID ) 75 MCG tablet Take 1 tablet (75 mcg total) by mouth daily before breakfast. Take on empty stomach, first thing in the morning, full glass of water, 30 min before anything else   lisinopril -hydrochlorothiazide  (ZESTORETIC ) 20-25 MG tablet Take 1 tablet by mouth daily.   minoxidil (LONITEN) 2.5 MG tablet Take 2.5 mg by mouth daily.   Multiple Vitamin (MULTIVITAMIN) capsule Take 1 capsule by mouth daily.   Omega-3 Fatty Acids (FISH OIL PO) Take by  mouth.   pravastatin  (PRAVACHOL ) 20 MG tablet TAKE 1 TABLET BY MOUTH EVERYDAY AT BEDTIME   testosterone  cypionate (DEPOTESTOSTERONE CYPIONATE) 200 MG/ML injection Inject 1 mL (200 mg total) into the muscle every 14 (fourteen) days. 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 07/25/2024   Alopecia 07/05/2023   Gastroesophageal reflux disease 07/05/2023   Gouty arthritis of right foot 07/05/2023   Mixed hyperlipidemia 07/05/2023   Erectile dysfunction 07/05/2023   Thyroid nodule 07/05/2023   Hypotestosteronism 07/05/2023   Vitamin D  deficiency 07/05/2023   Obesity 07/05/2023   Right thyroid nodule 07/05/2023   Reversed sleep wake cycle 07/05/2023   Essential hypertension 07/04/2023   Abnormal gait 01/25/2022   Knee pain 01/25/2022   Knee stiff 01/25/2022   Rupture of quadriceps tendon 01/25/2022   Obstructive sleep apnea syndrome 07/10/2018   Rupture of biceps tendon 06/12/2018   Shoulder strain 06/12/2018    Allergies  Allergen Reactions   Other Other (See Comments)    Dog Dander. [employer].    Atorvastatin Calcium Other (See Comments)    atorvastatin calcium   Cat Dander Other (See Comments)    Other reaction(s): Other (See Comments)  Cat/dog dander - watery eyes  Other reaction(s): Other (See Comments) Cat/dog dander - watery eyes  Cat/dog dander - watery eyes   Cat Hair Extract Other (See Comments)    cat hair extract   Atorvastatin Other (See Comments)    myalgia  atorvastatin    Immunization History  Administered Date(s) Administered   Influenza, Seasonal, Injecte, Preservative Fre 12/29/2023   Influenza-Unspecified 09/22/2022   Moderna Covid-19 Vaccine Bivalent Booster 48yrs & up 09/11/2021   Moderna Sars-Covid-2 Vaccination 11/21/2020, 04/24/2021   PFIZER(Purple Top)SARS-COV-2 Vaccination 02/27/2020, 03/19/2020    Pneumococcal Polysaccharide-23 10/12/2010, 01/14/2015   Td 10/12/2010, 03/30/2022   Tdap 03/30/2022   Zoster Recombinant(Shingrix) 09/19/2019, 11/27/2019    Past Surgical History:  Procedure Laterality Date   knee sugery Right    left elbow surgery     LEG SURGERY Left    X2 times   SHOULDER ARTHROSCOPY Right 08/21/2009   rotator cuff repair    Social History   Tobacco Use   Smoking status: Former    Current packs/day: 0.00    Types: Cigarettes    Quit date: 12/07/1991    Years since quitting: 32.6   Smokeless tobacco: Never  Vaping Use   Vaping status: Never Used  Substance Use Topics   Alcohol use: Yes    Alcohol/week: 7.0 standard drinks of alcohol    Types: 2 Glasses of wine, 5 Cans of beer per week    Comment: beer 1-2 a week   Drug use: No    Family History  Problem Relation Age of Onset   Hypertension Father    Alzheimer's disease Father    Sarcoidosis Sister        sarcoidosis lungs   Other Brother        brain damage   Birth defects Brother    Birth defects Son         07/25/2024    3:12 PM 12/29/2023    1:21 PM 07/04/2023    3:38 PM  GAD 7 : Generalized Anxiety Score  Nervous, Anxious, on Edge 0 0 0  Control/stop worrying 0 0 0  Worry too much - different things 0 0 0  Trouble relaxing 1 0 0  Restless 0 0 0  Easily annoyed or irritable 0 0 0  Afraid - awful might happen 0 0 0  Total GAD 7 Score 1 0 0  Anxiety Difficulty Not difficult at all Not difficult at all Not difficult at all       07/25/2024    3:12 PM 12/29/2023    1:21 PM 07/04/2023    3:38 PM  Depression screen PHQ 2/9  Decreased Interest 1 0 0  Down, Depressed, Hopeless 1 0 0  PHQ - 2 Score 2 0 0  Altered sleeping 0  0  Tired, decreased energy 0  2  Change in appetite 1  2  Feeling bad or failure about yourself  0  0  Trouble concentrating 0  0  Moving slowly or fidgety/restless 0  0  Suicidal thoughts 0  0  PHQ-9 Score 3  4  Difficult doing work/chores Not difficult at all   Not difficult at all    BP Readings from Last 3 Encounters:  07/25/24 (!) 180/98  06/04/24 (!) 168/92  01/31/24 (!) 169/100    Wt Readings from Last 3 Encounters:  07/25/24 248 lb (112.5 kg)  06/04/24 230 lb (104.3 kg)  01/31/24 230 lb (104.3 kg)    BP (!) 180/98 (Cuff Size: Large)   Pulse 81   Temp 99.1 F (37.3 C)   Ht 6' (  1.829 m)   Wt 248 lb (112.5 kg)   SpO2 96%   BMI 33.63 kg/m   Physical Exam Vitals and nursing note reviewed.  Constitutional:      Appearance: Normal appearance.  HENT:     Ears:     Comments: EAC clear bilaterally with good view of TM which is without effusion or erythema.     Nose: Nose normal.     Mouth/Throat:     Mouth: Mucous membranes are moist. No oral lesions.     Dentition: Normal dentition.     Pharynx: No posterior oropharyngeal erythema.  Eyes:     Extraocular Movements: Extraocular movements intact.     Conjunctiva/sclera: Conjunctivae normal.     Pupils: Pupils are equal, round, and reactive to light.  Neck:     Thyroid: No thyromegaly.  Cardiovascular:     Rate and Rhythm: Normal rate and regular rhythm.     Heart sounds: No murmur heard.    No friction rub. No gallop.     Comments: Pulses 2+ at radial, PT, DP bilaterally. No carotid bruit.  Pulmonary:     Effort: Pulmonary effort is normal.     Breath sounds: Normal breath sounds.  Abdominal:     General: Bowel sounds are normal.     Palpations: Abdomen is soft. There is no mass.     Tenderness: There is no abdominal tenderness.  Genitourinary:    Prostate: Normal. Not enlarged, not tender and no nodules present.     Rectum: Normal. Guaiac result negative. No mass.  Musculoskeletal:     Right lower leg: 1+ Pitting Edema present.     Left lower leg: 1+ Pitting Edema present.     Comments: Full ROM with strength 5/5 bilateral upper and lower extremities (except LLE not tested today - in a brace)  Lymphadenopathy:     Cervical: No cervical adenopathy.  Skin:     General: Skin is warm.     Capillary Refill: Capillary refill takes less than 2 seconds.     Findings: No lesion or rash.  Neurological:     Mental Status: He is alert and oriented to person, place, and time.     Gait: Gait is intact.  Psychiatric:        Mood and Affect: Mood normal.        Behavior: Behavior normal.     Recent Labs     Component Value Date/Time   NA 137 12/29/2023 1419   K 4.1 12/29/2023 1419   CL 99 12/29/2023 1419   CO2 23 12/29/2023 1419   GLUCOSE 102 (H) 12/29/2023 1419   BUN 19 12/29/2023 1419   CREATININE 1.06 12/29/2023 1419   CALCIUM 9.7 12/29/2023 1419   PROT 7.1 12/29/2023 1419   ALBUMIN 4.5 12/29/2023 1419   AST 32 12/29/2023 1419   ALT 44 12/29/2023 1419   ALKPHOS 72 12/29/2023 1419   BILITOT 0.5 12/29/2023 1419    Lab Results  Component Value Date   WBC 6.2 12/29/2023   HGB 16.7 12/29/2023   HCT 48.0 12/29/2023   MCV 93 12/29/2023   PLT 201 12/29/2023   No results found for: HGBA1C Lab Results  Component Value Date   CHOL 192 12/29/2023   HDL 34 (L) 12/29/2023   LDLCALC 99 12/29/2023   TRIG 349 (H) 12/29/2023   CHOLHDL 5.6 (H) 12/29/2023   Lab Results  Component Value Date   TSH 3.060 12/29/2023      Assessment  and Plan:  1. Annual physical exam (Primary) Encouraged healthy lifestyle including regular physical activity and consumption of whole fruits and vegetables. Encouraged routine dental and eye exams. Vaccinations up to date.   Patient will return for all lab work, as he will need to be fasting, before 10 AM, and midcycle with testosterone  therapy.  He verbalizes understanding.  These instructions were also written on his lab orders.  - CBC with Differential/Platelet - Comprehensive metabolic panel with GFR - TSH - Lipid panel  2. Essential hypertension Discussed very high blood pressure in clinic today x 2.  Patient has been out of carvedilol  for a week, will start by refilling this.  Encouraged home blood  pressure monitoring with a log for my review at short interval follow-up in 4 weeks.  - carvedilol  (COREG ) 12.5 MG tablet; Take 1 tablet (12.5 mg total) by mouth 2 (two) times daily with a meal.  Dispense: 180 tablet; Refill: 1  3. Mixed hyperlipidemia Stop pravastatin  with no plan for any future statin therapy due to 3 lifetime tendon ruptures with minimal exertion.  Will try Zetia  instead.  Continue gemfibrozil. - Lipid panel - ezetimibe  (ZETIA ) 10 MG tablet; Take 1 tablet (10 mg total) by mouth daily.  Dispense: 90 tablet; Refill: 1  4. 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 Screening PSA (prostate specific antigen) Normal prostate exam today, check PSA - PSA  6. Gouty arthritis of right foot Check uric acid on long-term allopurinol for gout prevention - Uric acid     Return in about 4 weeks (around 08/22/2024) for OV f/u HTN.    Rolan Hoyle, PA-C, DMSc, Nutritionist [facility] [phone]

## 2024-07-26 DIAGNOSIS — S46119D Strain of muscle, fascia and tendon of long head of biceps, unspecified arm, subsequent encounter: Secondary | ICD-10-CM | POA: Diagnosis not present

## 2024-07-26 DIAGNOSIS — M25562 Pain in left knee: Secondary | ICD-10-CM | POA: Diagnosis not present

## 2024-07-26 DIAGNOSIS — R600 Localized edema: Secondary | ICD-10-CM | POA: Diagnosis not present

## 2024-07-30 DIAGNOSIS — M25562 Pain in left knee: Secondary | ICD-10-CM | POA: Diagnosis not present

## 2024-07-30 DIAGNOSIS — R600 Localized edema: Secondary | ICD-10-CM | POA: Diagnosis not present

## 2024-08-09 DIAGNOSIS — R600 Localized edema: Secondary | ICD-10-CM | POA: Diagnosis not present

## 2024-08-09 DIAGNOSIS — M25562 Pain in left knee: Secondary | ICD-10-CM | POA: Diagnosis not present

## 2024-08-15 DIAGNOSIS — R600 Localized edema: Secondary | ICD-10-CM | POA: Diagnosis not present

## 2024-08-15 DIAGNOSIS — M25562 Pain in left knee: Secondary | ICD-10-CM | POA: Diagnosis not present

## 2024-08-16 ENCOUNTER — Ambulatory Visit: Admitting: Podiatry

## 2024-08-20 DIAGNOSIS — M25562 Pain in left knee: Secondary | ICD-10-CM | POA: Diagnosis not present

## 2024-08-20 DIAGNOSIS — R600 Localized edema: Secondary | ICD-10-CM | POA: Diagnosis not present

## 2024-08-21 DIAGNOSIS — Z125 Encounter for screening for malignant neoplasm of prostate: Secondary | ICD-10-CM | POA: Diagnosis not present

## 2024-08-21 DIAGNOSIS — Z0000 Encounter for general adult medical examination without abnormal findings: Secondary | ICD-10-CM | POA: Diagnosis not present

## 2024-08-21 DIAGNOSIS — E782 Mixed hyperlipidemia: Secondary | ICD-10-CM | POA: Diagnosis not present

## 2024-08-21 DIAGNOSIS — Z79890 Hormone replacement therapy (postmenopausal): Secondary | ICD-10-CM | POA: Diagnosis not present

## 2024-08-21 DIAGNOSIS — M109 Gout, unspecified: Secondary | ICD-10-CM | POA: Diagnosis not present

## 2024-08-22 ENCOUNTER — Ambulatory Visit: Payer: Self-pay | Admitting: Physician Assistant

## 2024-08-22 LAB — COMPREHENSIVE METABOLIC PANEL WITH GFR
ALT: 29 IU/L (ref 0–44)
AST: 27 IU/L (ref 0–40)
Albumin: 4.8 g/dL (ref 3.8–4.9)
Alkaline Phosphatase: 94 IU/L (ref 47–123)
BUN/Creatinine Ratio: 14 (ref 9–20)
BUN: 15 mg/dL (ref 6–24)
Bilirubin Total: 0.7 mg/dL (ref 0.0–1.2)
CO2: 21 mmol/L (ref 20–29)
Calcium: 9.7 mg/dL (ref 8.7–10.2)
Chloride: 98 mmol/L (ref 96–106)
Creatinine, Ser: 1.04 mg/dL (ref 0.76–1.27)
Globulin, Total: 2.9 g/dL (ref 1.5–4.5)
Glucose: 96 mg/dL (ref 70–99)
Potassium: 4.4 mmol/L (ref 3.5–5.2)
Sodium: 135 mmol/L (ref 134–144)
Total Protein: 7.7 g/dL (ref 6.0–8.5)
eGFR: 83 mL/min/1.73 (ref >59)

## 2024-08-22 LAB — CBC WITH DIFFERENTIAL/PLATELET
Basophils Absolute: 0 x10E3/uL (ref 0.0–0.2)
Basos: 1 %
EOS (ABSOLUTE): 0.7 x10E3/uL — ABNORMAL HIGH (ref 0.0–0.4)
Eos: 12 %
Hematocrit: 45.1 % (ref 37.5–51.0)
Hemoglobin: 15.2 g/dL (ref 13.0–17.7)
Immature Grans (Abs): 0 x10E3/uL (ref 0.0–0.1)
Immature Granulocytes: 0 %
Lymphocytes Absolute: 1.3 x10E3/uL (ref 0.7–3.1)
Lymphs: 26 %
MCH: 31.8 pg (ref 26.6–33.0)
MCHC: 33.7 g/dL (ref 31.5–35.7)
MCV: 94 fL (ref 79–97)
Monocytes Absolute: 0.5 x10E3/uL (ref 0.1–0.9)
Monocytes: 9 %
Neutrophils Absolute: 2.7 x10E3/uL (ref 1.4–7.0)
Neutrophils: 52 %
Platelets: 215 x10E3/uL (ref 150–450)
RBC: 4.78 x10E6/uL (ref 4.14–5.80)
RDW: 13.5 % (ref 11.6–15.4)
WBC: 5.2 x10E3/uL (ref 3.4–10.8)

## 2024-08-22 LAB — LIPID PANEL
Chol/HDL Ratio: 5.4 ratio — ABNORMAL HIGH (ref 0.0–5.0)
Cholesterol, Total: 190 mg/dL (ref 100–199)
HDL: 35 mg/dL — ABNORMAL LOW (ref >39)
LDL Chol Calc (NIH): 116 mg/dL — ABNORMAL HIGH (ref 0–99)
Triglycerides: 222 mg/dL — ABNORMAL HIGH (ref 0–149)
VLDL Cholesterol Cal: 39 mg/dL (ref 5–40)

## 2024-08-22 LAB — URIC ACID: Uric Acid: 6 mg/dL (ref 3.8–8.4)

## 2024-08-22 LAB — PSA: Prostate Specific Ag, Serum: 0.3 ng/mL (ref 0.0–4.0)

## 2024-08-22 LAB — TESTOSTERONE: Testosterone: 311 ng/dL (ref 264–916)

## 2024-08-22 LAB — TSH: TSH: 2.09 u[IU]/mL (ref 0.450–4.500)

## 2024-08-27 ENCOUNTER — Ambulatory Visit: Admitting: Physician Assistant

## 2024-08-27 ENCOUNTER — Encounter: Payer: Self-pay | Admitting: Physician Assistant

## 2024-08-27 DIAGNOSIS — R600 Localized edema: Secondary | ICD-10-CM | POA: Diagnosis not present

## 2024-08-27 DIAGNOSIS — I10 Essential (primary) hypertension: Secondary | ICD-10-CM

## 2024-08-27 DIAGNOSIS — M25562 Pain in left knee: Secondary | ICD-10-CM | POA: Diagnosis not present

## 2024-08-27 MED ORDER — GEMFIBROZIL 600 MG PO TABS: 600.0000 mg | ORAL_TABLET | Freq: Two times a day (BID) | ORAL | 1 refills | Status: AC

## 2024-08-27 MED ORDER — CARVEDILOL 25 MG PO TABS: 25.0000 mg | ORAL_TABLET | Freq: Two times a day (BID) | ORAL | 1 refills | Status: AC

## 2024-08-27 NOTE — Progress Notes (Signed)
 Date:  08/27/2024   Name:  Eugene [name]   DOB:  30-Aug-1966   MRN:  [id number]   Chief Complaint: Hypertension  HPI [name] returns to clinic for 1 month follow-up on HTN after two very high blood pressure readings in clinic at our last visit 07/25/2024.  At that time, he had been out of carvedilol  for a week, so we refilled at 12.5 mg twice daily.  He has been monitoring blood pressure at home and close his readings in the 140s/90s.  Unrelated, I had also switched him to Zetia  with no plan for future statin trials given his 3 documented tendon ruptures at different sites. He is tolerating the Zetia  well. Needs refill on gemfibrozil .    Medication list has been reviewed and updated.  Current Meds  Medication Sig   albuterol  (VENTOLIN  HFA) 108 (90 Base) MCG/ACT inhaler Inhale 1-2 puffs into the lungs every 6 (six) hours as needed for wheezing or shortness of breath.   allopurinol (ZYLOPRIM) 300 MG tablet Take 300 mg by mouth daily.   ergocalciferol  (VITAMIN D2) 1.25 MG (50000 UT) capsule Take 1 capsule (50,000 Units total) by mouth once a week.   ezetimibe  (ZETIA ) 10 MG tablet Take 1 tablet (10 mg total) by mouth daily.   finasteride (PROPECIA) 1 MG tablet Take 1 tablet by mouth daily.   fluticasone  (FLONASE ) 50 MCG/ACT nasal spray SPRAY 1 SPRAY INTO BOTH NOSTRILS DAILY.   hydrocortisone 2.5 % cream as needed.   levothyroxine  (SYNTHROID ) 75 MCG tablet Take 1 tablet (75 mcg total) by mouth daily before breakfast. Take on empty stomach, first thing in the morning, full glass of water, 30 min before anything else   lisinopril -hydrochlorothiazide  (ZESTORETIC ) 20-25 MG tablet Take 1 tablet by mouth daily.   minoxidil (LONITEN) 2.5 MG tablet Take 2.5 mg by mouth daily.   Multiple Vitamin (MULTIVITAMIN) capsule Take 1 capsule by mouth daily.   Omega-3 Fatty Acids (FISH OIL PO) Take by mouth.   testosterone  cypionate (DEPOTESTOSTERONE CYPIONATE) 200 MG/ML injection Inject 1 mL (200 mg total)  into the muscle every 14 (fourteen) days. Store at room temperature in a dark place   triamcinolone cream (KENALOG) 0.1 % SMARTSIG:1 Application Topical 2-3 Times Daily   [DISCONTINUED] carvedilol  (COREG ) 12.5 MG tablet Take 1 tablet (12.5 mg total) by mouth 2 (two) times daily with a meal.   [DISCONTINUED] gemfibrozil  (LOPID ) 600 MG tablet 2 (two) times daily before a meal.     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 07/25/2024   Alopecia 07/05/2023   Gastroesophageal reflux disease 07/05/2023   Gouty arthritis of right foot 07/05/2023   Mixed hyperlipidemia 07/05/2023   Erectile dysfunction 07/05/2023   Thyroid nodule 07/05/2023   Hypotestosteronism 07/05/2023   Vitamin D  deficiency 07/05/2023   Obesity 07/05/2023   Right thyroid nodule 07/05/2023   Reversed sleep wake cycle 07/05/2023   Essential hypertension 07/04/2023   Abnormal gait 01/25/2022   Knee pain 01/25/2022   Knee stiff 01/25/2022   Rupture of quadriceps tendon 01/25/2022   Obstructive sleep apnea syndrome 07/10/2018   Rupture of biceps tendon 06/12/2018   Shoulder strain 06/12/2018    Allergies  Allergen Reactions   Other Other (See Comments)    Dog Dander. [employer].    Statins     Myalgias and hx multiple tendon ruptures   Atorvastatin Calcium Other (See Comments)    atorvastatin  calcium   Cat Dander Other (See Comments)    Other reaction(s): Other (See Comments)  Cat/dog dander - watery eyes  Other reaction(s): Other (See Comments) Cat/dog dander - watery eyes    Cat/dog dander - watery eyes   Cat Hair Extract Other (See Comments)    cat hair extract   Atorvastatin Other (See Comments)    myalgia  atorvastatin    Immunization History  Administered Date(s) Administered   Influenza, Seasonal, Injecte, Preservative Fre 12/29/2023   Influenza-Unspecified 09/22/2022   Moderna Covid-19 Vaccine  Bivalent Booster 47yrs & up 09/11/2021   Moderna Sars-Covid-2 Vaccination 11/21/2020, 04/24/2021   PFIZER(Purple Top)SARS-COV-2 Vaccination 02/27/2020, 03/19/2020   Pneumococcal Polysaccharide-23 10/12/2010, 01/14/2015   Td 10/12/2010, 03/30/2022   Tdap 03/30/2022   Zoster Recombinant(Shingrix) 09/19/2019, 11/27/2019    Past Surgical History:  Procedure Laterality Date   knee sugery Right    KNEE SURGERY Left    5/26 and 6/25   left elbow surgery     LEG SURGERY Left    X2 times   SHOULDER ARTHROSCOPY Right 08/21/2009   rotator cuff repair    Social History   Tobacco Use   Smoking status: Former    Current packs/day: 0.00    Types: Cigarettes    Quit date: 12/07/1991    Years since quitting: 32.7   Smokeless tobacco: Never  Vaping Use   Vaping status: Never Used  Substance Use Topics   Alcohol use: Yes    Alcohol/week: 7.0 standard drinks of alcohol    Types: 2 Glasses of wine, 5 Cans of beer per week    Comment: beer 1-2 a week   Drug use: No    Family History  Problem Relation Age of Onset   Hypertension Father    Alzheimer's disease Father    Sarcoidosis Sister        sarcoidosis lungs   Other Brother        brain damage   Birth defects Brother    Birth defects Son         07/25/2024    3:12 PM 12/29/2023    1:21 PM 07/04/2023    3:38 PM  GAD 7 : Generalized Anxiety Score  Nervous, Anxious, on Edge 0 0 0  Control/stop worrying 0 0 0  Worry too much - different things 0 0 0  Trouble relaxing 1 0 0  Restless 0 0 0  Easily annoyed or irritable 0 0 0  Afraid - awful might happen 0 0 0  Total GAD 7 Score 1 0 0  Anxiety Difficulty Not difficult at all Not difficult at all Not difficult at all       07/25/2024    3:12 PM 12/29/2023    1:21 PM 07/04/2023    3:38 PM  Depression screen PHQ 2/9  Decreased Interest 1 0 0  Down, Depressed, Hopeless 1 0 0  PHQ - 2 Score 2 0 0  Altered sleeping 0  0  Tired, decreased energy 0  2  Change in appetite 1  2   Feeling bad or failure about yourself  0  0  Trouble concentrating 0  0  Moving slowly or fidgety/restless 0  0  Suicidal thoughts 0  0  PHQ-9 Score 3  4  Difficult doing work/chores Not difficult at all  Not difficult at all    BP Readings from Last 3 Encounters:  08/27/24 (!) 148/84  07/25/24 (!) 180/98  06/04/24 (!) 168/92    Wt  Readings from Last 3 Encounters:  08/27/24 249 lb (112.9 kg)  07/25/24 248 lb (112.5 kg)  06/04/24 230 lb (104.3 kg)    BP (!) 148/84 (Cuff Size: Large)   Pulse 79   Temp 98.5 F (36.9 C)   Ht 6' (1.829 m)   Wt 249 lb (112.9 kg)   SpO2 97%   BMI 33.77 kg/m   Physical Exam Vitals and nursing note reviewed.  Constitutional:      Appearance: Normal appearance.  Cardiovascular:     Rate and Rhythm: Normal rate.  Pulmonary:     Effort: Pulmonary effort is normal.  Abdominal:     General: There is no distension.  Musculoskeletal:        General: Normal range of motion.  Skin:    General: Skin is warm and dry.  Neurological:     Mental Status: He is alert and oriented to person, place, and time.     Gait: Gait is intact.  Psychiatric:        Mood and Affect: Mood and affect normal.     Recent Labs     Component Value Date/Time   NA 135 08/21/2024 1036   K 4.4 08/21/2024 1036   CL 98 08/21/2024 1036   CO2 21 08/21/2024 1036   GLUCOSE 96 08/21/2024 1036   BUN 15 08/21/2024 1036   CREATININE 1.04 08/21/2024 1036   CALCIUM 9.7 08/21/2024 1036   PROT 7.7 08/21/2024 1036   ALBUMIN 4.8 08/21/2024 1036   AST 27 08/21/2024 1036   ALT 29 08/21/2024 1036   ALKPHOS 94 08/21/2024 1036   BILITOT 0.7 08/21/2024 1036    Lab Results  Component Value Date   WBC 5.2 08/21/2024   HGB 15.2 08/21/2024   HCT 45.1 08/21/2024   MCV 94 08/21/2024   PLT 215 08/21/2024   No results found for: HGBA1C Lab Results  Component Value Date   CHOL 190 08/21/2024   HDL 35 (L) 08/21/2024   LDLCALC 116 (H) 08/21/2024   TRIG 222 (H) 08/21/2024    CHOLHDL 5.4 (H) 08/21/2024   Lab Results  Component Value Date   TSH 2.090 08/21/2024      Assessment and Plan:  Essential hypertension Assessment & Plan: Increase carvedilol  to 25 mg BID. Continue home BP monitoring.  Orders: -     Carvedilol ; Take 1 tablet (25 mg total) by mouth 2 (two) times daily with a meal.  Dispense: 180 tablet; Refill: 1  Other orders -     Gemfibrozil ; Take 1 tablet (600 mg total) by mouth 2 (two) times daily before a meal.  Dispense: 180 tablet; Refill: 1     Return in about 4 weeks (around 09/24/2024) for OV f/u HTN.    Rolan Hoyle, PA-C, DMSc, Nutritionist [facility] Primary Care and Sports Medicine [facility] [phone]

## 2024-08-27 NOTE — Assessment & Plan Note (Signed)
 Increase carvedilol  to 25 mg BID. Continue home BP monitoring.

## 2024-08-29 DIAGNOSIS — S83512S Sprain of anterior cruciate ligament of left knee, sequela: Secondary | ICD-10-CM | POA: Diagnosis not present

## 2024-08-29 DIAGNOSIS — S76112S Strain of left quadriceps muscle, fascia and tendon, sequela: Secondary | ICD-10-CM | POA: Diagnosis not present

## 2024-08-29 DIAGNOSIS — M25562 Pain in left knee: Secondary | ICD-10-CM | POA: Diagnosis not present

## 2024-09-05 DIAGNOSIS — M25562 Pain in left knee: Secondary | ICD-10-CM | POA: Diagnosis not present

## 2024-09-05 DIAGNOSIS — R600 Localized edema: Secondary | ICD-10-CM | POA: Diagnosis not present

## 2024-09-11 DIAGNOSIS — R600 Localized edema: Secondary | ICD-10-CM | POA: Diagnosis not present

## 2024-09-11 DIAGNOSIS — M25562 Pain in left knee: Secondary | ICD-10-CM | POA: Diagnosis not present

## 2024-09-26 ENCOUNTER — Encounter: Payer: Self-pay | Admitting: Physician Assistant

## 2024-09-26 ENCOUNTER — Ambulatory Visit: Admitting: Physician Assistant

## 2024-09-26 VITALS — BP 150/78 | HR 74 | Ht 72.0 in | Wt 252.0 lb

## 2024-09-26 DIAGNOSIS — Z4782 Encounter for orthopedic aftercare following scoliosis surgery: Secondary | ICD-10-CM | POA: Diagnosis not present

## 2024-09-26 DIAGNOSIS — M25562 Pain in left knee: Secondary | ICD-10-CM | POA: Diagnosis not present

## 2024-09-26 DIAGNOSIS — I10 Essential (primary) hypertension: Secondary | ICD-10-CM | POA: Diagnosis not present

## 2024-09-26 NOTE — Progress Notes (Signed)
 Date:  09/26/2024   Name:  Eugene [name]   DOB:  1966-06-06   MRN:  [id number]   Chief Complaint: Hypertension  HPI [name] returns for 1 month f/u on HTN after increasing carvedilol  to 25 mg BID.  He has tolerated this change fairly well, might have noticed slightly decreased exercise tolerance but also admits this could just be deconditioning as he is getting back into the gym.  He tells me that most of his home blood pressure readings are in the 130s systolic.  Occasionally has outliers in the 140s.   Medication list has been reviewed and updated.  Current Meds  Medication Sig   albuterol  (VENTOLIN  HFA) 108 (90 Base) MCG/ACT inhaler Inhale 1-2 puffs into the lungs every 6 (six) hours as needed for wheezing or shortness of breath.   allopurinol (ZYLOPRIM) 300 MG tablet Take 300 mg by mouth daily.   carvedilol  (COREG ) 25 MG tablet Take 1 tablet (25 mg total) by mouth 2 (two) times daily with a meal.   ergocalciferol  (VITAMIN D2) 1.25 MG (50000 UT) capsule Take 1 capsule (50,000 Units total) by mouth once a week.   ezetimibe  (ZETIA ) 10 MG tablet Take 1 tablet (10 mg total) by mouth daily.   finasteride (PROPECIA) 1 MG tablet Take 1 tablet by mouth daily.   fluticasone  (FLONASE ) 50 MCG/ACT nasal spray SPRAY 1 SPRAY INTO BOTH NOSTRILS DAILY.   gemfibrozil  (LOPID ) 600 MG tablet Take 1 tablet (600 mg total) by mouth 2 (two) times daily before a meal.   hydrocortisone 2.5 % cream as needed.   ipratropium (ATROVENT ) 0.06 % nasal spray Place 2 sprays into both nostrils 4 (four) times daily.   levothyroxine  (SYNTHROID ) 75 MCG tablet Take 1 tablet (75 mcg total) by mouth daily before breakfast. Take on empty stomach, first thing in the morning, full glass of water, 30 min before anything else   lisinopril -hydrochlorothiazide  (ZESTORETIC ) 20-25 MG tablet Take 1 tablet by mouth daily.   minoxidil (LONITEN) 2.5 MG tablet Take 2.5 mg by mouth daily.   Multiple Vitamin (MULTIVITAMIN) capsule Take  1 capsule by mouth daily.   Omega-3 Fatty Acids (FISH OIL PO) Take by mouth.   testosterone  cypionate (DEPOTESTOSTERONE CYPIONATE) 200 MG/ML injection Inject 1 mL (200 mg total) into the muscle every 14 (fourteen) days. 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 07/25/2024   Alopecia 07/05/2023   Gastroesophageal reflux disease 07/05/2023   Gouty arthritis of right foot 07/05/2023   Mixed hyperlipidemia 07/05/2023   Erectile dysfunction 07/05/2023   Thyroid nodule 07/05/2023   Hypotestosteronism 07/05/2023   Vitamin D  deficiency 07/05/2023   Obesity 07/05/2023   Right thyroid nodule 07/05/2023   Reversed sleep wake cycle 07/05/2023   Essential hypertension 07/04/2023   Abnormal gait 01/25/2022   Knee pain 01/25/2022   Knee stiff 01/25/2022   Rupture of quadriceps tendon 01/25/2022   Obstructive sleep apnea syndrome 07/10/2018   Rupture of biceps tendon 06/12/2018   Shoulder strain 06/12/2018    Allergies  Allergen Reactions   Other Other (See Comments)    Dog Dander. [employer].    Statins     Myalgias and hx multiple tendon ruptures   Atorvastatin Calcium Other (See Comments)    atorvastatin calcium  Cat Dander Other (See Comments)    Other reaction(s): Other (See Comments)  Cat/dog dander - watery eyes  Other reaction(s): Other (See Comments) Cat/dog dander - watery eyes    Cat/dog dander - watery eyes   Cat Hair Extract Other (See Comments)    cat hair extract   Atorvastatin Other (See Comments)    myalgia  atorvastatin    Immunization History  Administered Date(s) Administered   Influenza, Seasonal, Injecte, Preservative Fre 12/29/2023   Influenza-Unspecified 09/22/2022   Moderna Covid-19 Vaccine Bivalent Booster 82yrs & up  09/11/2021   Moderna Sars-Covid-2 Vaccination 11/21/2020, 04/24/2021   PFIZER(Purple Top)SARS-COV-2 Vaccination 02/27/2020, 03/19/2020   Pneumococcal Polysaccharide-23 10/12/2010, 01/14/2015   Td 10/12/2010, 03/30/2022   Tdap 03/30/2022   Zoster Recombinant(Shingrix) 09/19/2019, 11/27/2019    Past Surgical History:  Procedure Laterality Date   knee sugery Right    KNEE SURGERY Left    5/26 and 6/25   left elbow surgery     LEG SURGERY Left    X2 times   SHOULDER ARTHROSCOPY Right 08/21/2009   rotator cuff repair    Social History   Tobacco Use   Smoking status: Former    Current packs/day: 0.00    Types: Cigarettes    Quit date: 12/07/1991    Years since quitting: 32.8   Smokeless tobacco: Never  Vaping Use   Vaping status: Never Used  Substance Use Topics   Alcohol use: Yes    Alcohol/week: 7.0 standard drinks of alcohol    Types: 2 Glasses of wine, 5 Cans of beer per week    Comment: beer 1-2 a week   Drug use: No    Family History  Problem Relation Age of Onset   Hypertension Father    Alzheimer's disease Father    Sarcoidosis Sister        sarcoidosis lungs   Other Brother        brain damage   Birth defects Brother    Birth defects Son         09/26/2024    2:14 PM 07/25/2024    3:12 PM 12/29/2023    1:21 PM 07/04/2023    3:38 PM  GAD 7 : Generalized Anxiety Score  Nervous, Anxious, on Edge 0 0 0 0  Control/stop worrying 0 0 0 0  Worry too much - different things 0 0 0 0  Trouble relaxing 0 1 0 0  Restless 0 0 0 0  Easily annoyed or irritable 0 0 0 0  Afraid - awful might happen 0 0 0 0  Total GAD 7 Score 0 1 0 0  Anxiety Difficulty Not difficult at all Not difficult at all Not difficult at all Not difficult at all       09/26/2024    2:14 PM 07/25/2024    3:12 PM 12/29/2023    1:21 PM  Depression screen PHQ 2/9  Decreased Interest 0 1 0  Down, Depressed, Hopeless 0 1 0  PHQ - 2 Score 0 2 0  Altered sleeping  0   Tired, decreased energy  0    Change in appetite  1   Feeling bad or failure about yourself   0   Trouble concentrating  0   Moving slowly or fidgety/restless  0   Suicidal thoughts  0   PHQ-9 Score  3   Difficult doing work/chores  Not difficult at all     BP Readings from Last 3 Encounters:  09/26/24 (!) 150/78  08/27/24 (!) 148/84  07/25/24 (!) 180/98    Wt Readings from Last 3 Encounters:  09/26/24 252 lb (114.3 kg)  08/27/24 249 lb (112.9 kg)  07/25/24 248 lb (112.5 kg)    BP (!) 150/78 (Cuff Size: Large)   Pulse 74   Ht 6' (1.829 m)   Wt 252 lb (114.3 kg)   SpO2 96%   BMI 34.18 kg/m   Physical Exam Vitals and nursing note reviewed.  Constitutional:      Appearance: Normal appearance.  Cardiovascular:     Rate and Rhythm: Normal rate.  Pulmonary:     Effort: Pulmonary effort is normal.  Abdominal:     General: There is no distension.  Musculoskeletal:        General: Normal range of motion.  Skin:    General: Skin is warm and dry.  Neurological:     Mental Status: He is alert and oriented to person, place, and time.     Gait: Gait is intact.  Psychiatric:        Mood and Affect: Mood and affect normal.     Recent Labs     Component Value Date/Time   NA 135 08/21/2024 1036   K 4.4 08/21/2024 1036   CL 98 08/21/2024 1036   CO2 21 08/21/2024 1036   GLUCOSE 96 08/21/2024 1036   BUN 15 08/21/2024 1036   CREATININE 1.04 08/21/2024 1036   CALCIUM 9.7 08/21/2024 1036   PROT 7.7 08/21/2024 1036   ALBUMIN 4.8 08/21/2024 1036   AST 27 08/21/2024 1036   ALT 29 08/21/2024 1036   ALKPHOS 94 08/21/2024 1036   BILITOT 0.7 08/21/2024 1036    Lab Results  Component Value Date   WBC 5.2 08/21/2024   HGB 15.2 08/21/2024   HCT 45.1 08/21/2024   MCV 94 08/21/2024   PLT 215 08/21/2024   No results found for: HGBA1C Lab Results  Component Value Date   CHOL 190 08/21/2024   HDL 35 (L) 08/21/2024   LDLCALC 116 (H) 08/21/2024   TRIG 222 (H) 08/21/2024   CHOLHDL 5.4 (H) 08/21/2024    Lab Results  Component Value Date   TSH 2.090 08/21/2024      Assessment and Plan:  Essential hypertension Assessment & Plan: No change to pharmacotherapy today.  Continue medications as prescribed.  Continue lifestyle improvements with home blood pressure monitoring.  Patient to let me know if readings are consistently above 140 systolic, otherwise I will plan to see him back in about 3 months.      Return in about 3 months (around 12/27/2024) for OV f/u HTN.    Rolan Hoyle, PA-C, DMSc, Nutritionist [facility] [phone]

## 2024-09-26 NOTE — Assessment & Plan Note (Signed)
 No change to pharmacotherapy today.  Continue medications as prescribed.  Continue lifestyle improvements with home blood pressure monitoring.  Patient to let me know if readings are consistently above 140 systolic, otherwise I will plan to see him back in about 3 months.

## 2024-10-04 DIAGNOSIS — M25562 Pain in left knee: Secondary | ICD-10-CM | POA: Diagnosis not present

## 2024-10-04 DIAGNOSIS — R600 Localized edema: Secondary | ICD-10-CM | POA: Diagnosis not present

## 2024-10-12 ENCOUNTER — Other Ambulatory Visit: Payer: Self-pay | Admitting: Physician Assistant

## 2024-10-12 DIAGNOSIS — E785 Hyperlipidemia, unspecified: Secondary | ICD-10-CM

## 2024-10-12 NOTE — Telephone Encounter (Signed)
 Requested Prescriptions  Refused Prescriptions Disp Refills   pravastatin  (PRAVACHOL ) 20 MG tablet [Pharmacy Med Name: PRAVASTATIN  SODIUM 20 MG TAB] 90 tablet 0    Sig: TAKE 1 TABLET BY MOUTH EVERYDAY AT BEDTIME     Cardiovascular:  Antilipid - Statins Failed - 10/12/2024  5:53 PM      Failed - Lipid Panel in normal range within the last 12 months    Cholesterol, Total  Date Value Ref Range Status  08/21/2024 190 100 - 199 mg/dL Final   LDL Chol Calc (NIH)  Date Value Ref Range Status  08/21/2024 116 (H) 0 - 99 mg/dL Final   HDL  Date Value Ref Range Status  08/21/2024 35 (L) >39 mg/dL Final   Triglycerides  Date Value Ref Range Status  08/21/2024 222 (H) 0 - 149 mg/dL Final         Passed - Patient is not pregnant      Passed - Valid encounter within last 12 months    Recent Outpatient Visits           2 weeks ago Essential hypertension    Primary Care & Sports Medicine at [facility], [place], [place]   1 month ago Essential hypertension   [facility] Primary Care & Sports Medicine at [facility], [place] SQU[place], [place]   2 months ago Annual physical exam   [facility] Care & Sports Medicine at [facility], [place], [place]

## 2024-10-21 ENCOUNTER — Other Ambulatory Visit: Payer: Self-pay | Admitting: Physician Assistant

## 2024-10-23 NOTE — Telephone Encounter (Signed)
 Requested medication (s) are due for refill today - no  Requested medication (s) are on the active medication list -yes  Future visit scheduled -yes  Last refill: 11/22/23 #12 3RF  Notes to clinic: high dose medication- requires provider review   Requested Prescriptions  Pending Prescriptions Disp Refills   Vitamin D , Ergocalciferol , (DRISDOL ) 1.25 MG (50000 UNIT) CAPS capsule [Pharmacy Med Name: VITAMIN D2 1.25MG (50,000 UNIT)] 12 capsule 3    Sig: TAKE 1 CAPSULE BY MOUTH ONE TIME PER WEEK     Endocrinology:  Vitamins - Vitamin D  Supplementation 2 Failed - 10/23/2024  1:07 PM      Failed - Manual Review: Route requests for 50,000 IU strength to the provider      Failed - Vitamin D  in normal range and within 360 days    Vit D, 25-Hydroxy  Date Value Ref Range Status  07/04/2023 33.0 30.0 - 100.0 ng/mL Final    Comment:    Vitamin D  deficiency has been defined by the Institute of Medicine and an Endocrine Society practice guideline as a level of serum 25-OH vitamin D  less than 20 ng/mL (1,2). The Endocrine Society went on to further define vitamin D  insufficiency as a level between 21 and 29 ng/mL (2). 1. IOM (Institute of Medicine). 2010. Dietary reference    intakes for calcium and D. [place]: The    [employer]. 2. Holick MF, Binkley Humphreys, Bischoff-Ferrari HA, et al.    Evaluation, treatment, and prevention of vitamin D     deficiency: an Endocrine Society clinical practice    guideline. JCEM. 2011 Jul; 96(7):1911-30.          Passed - Ca in normal range and within 360 days    Calcium  Date Value Ref Range Status  08/21/2024 9.7 8.7 - 10.2 mg/dL Final         Passed - Valid encounter within last 12 months    Recent Outpatient Visits           3 weeks ago Essential hypertension   [place] [facility] Medicine at [facility], [place]   1 month ago Essential hypertension   [place] Primary Care & Sports Medicine at  [facility], [place] SQUIB[place], [place]   3 months ago Annual physical exam   [facility] Primary Care & Sports Medicine at [facility], [place] SQU[place], [place]                 Requested Prescriptions  Pending Prescriptions Disp Refills   Vitamin D , Ergocalciferol , (DRISDOL ) 1.25 MG (50000 UNIT) CAPS capsule [Pharmacy Med Name: VITAMIN D2 1.25MG (50,000 UNIT)] 12 capsule 3    Sig: TAKE 1 CAPSULE BY MOUTH ONE TIME PER WEEK     Endocrinology:  Vitamins - Vitamin D  Supplementation 2 Failed - 10/23/2024  1:07 PM      Failed - Manual Review: Route requests for 50,000 IU strength to the provider      Failed - Vitamin D  in normal range and within 360 days    Vit D, 25-Hydroxy  Date Value Ref Range Status  07/04/2023 33.0 30.0 - 100.0 ng/mL Final    Comment:    Vitamin D  deficiency has been defined by the Institute of Medicine and an Endocrine Society practice guideline as a level of serum 25-OH vitamin D  less than 20 ng/mL (1,2). The Endocrine Society went on to further define vitamin D  insufficiency as a level between 21 and 29 ng/mL (  2). 1. IOM (Institute of Medicine). 2010. Dietary reference    intakes for calcium and D. [place]: The    [employer]. 2. Holick MF, Binkley Cass, Bischoff-Ferrari HA, et al.    Evaluation, treatment, and prevention of vitamin D     deficiency: an Endocrine Society clinical practice    guideline. JCEM. 2011 Jul; 96(7):1911-30.          Passed - Ca in normal range and within 360 days    Calcium  Date Value Ref Range Status  08/21/2024 9.7 8.7 - 10.2 mg/dL Final         Passed - Valid encounter within last 12 months    Recent Outpatient Visits           3 weeks ago Essential hypertension   [place] Primary Care & Sports Medicine at [facility], [place]   1 month ago Essential hypertension   [facility] Primary Care & Sports Medicine at [facility], Torib[place] SQU[place], [place]   3 months ago Annual  physical exam   [facility] Primary Care & Sports Medicine at [facility], [place] SQU[place], [place]

## 2024-11-15 DIAGNOSIS — M25562 Pain in left knee: Secondary | ICD-10-CM | POA: Diagnosis not present

## 2024-11-15 DIAGNOSIS — G4733 Obstructive sleep apnea (adult) (pediatric): Secondary | ICD-10-CM | POA: Diagnosis not present

## 2024-11-19 ENCOUNTER — Other Ambulatory Visit: Payer: Self-pay | Admitting: Physician Assistant

## 2024-11-19 ENCOUNTER — Ambulatory Visit: Admitting: Internal Medicine

## 2024-11-21 NOTE — Telephone Encounter (Signed)
 Requested Prescriptions  Pending Prescriptions Disp Refills   lisinopril -hydrochlorothiazide  (ZESTORETIC ) 20-25 MG tablet [Pharmacy Med Name: LISINOPRIL -HCTZ 20-25 MG TAB] 90 tablet 0    Sig: TAKE 1 TABLET BY MOUTH EVERY DAY     Cardiovascular:  ACEI + Diuretic Combos Failed - 11/21/2024 11:11 AM      Failed - Last BP in normal range    BP Readings from Last 1 Encounters:  09/26/24 (!) 150/78         Passed - Na in normal range and within 180 days    Sodium  Date Value Ref Range Status  08/21/2024 135 134 - 144 mmol/L Final         Passed - K in normal range and within 180 days    Potassium  Date Value Ref Range Status  08/21/2024 4.4 3.5 - 5.2 mmol/L Final         Passed - Cr in normal range and within 180 days    Creatinine, Ser  Date Value Ref Range Status  08/21/2024 1.04 0.76 - 1.27 mg/dL Final         Passed - eGFR is 30 or above and within 180 days    eGFR  Date Value Ref Range Status  08/21/2024 83 >59 mL/min/1.73 Final         Passed - Patient is not pregnant      Passed - Valid encounter within last 6 months    Recent Outpatient Visits           1 month ago Essential hypertension   [place] Primary Care & Sports Medicine at [facility], [place]   2 months ago Essential hypertension   [facility] Medicine at [facility], [place]   3 months ago Annual physical exam   [facility] Primary Care & Sports Medicine at [facility], [place] SQU[place], [place]

## 2024-11-22 ENCOUNTER — Ambulatory Visit
Admission: EM | Admit: 2024-11-22 | Discharge: 2024-11-22 | Disposition: A | Discharge status: Home / Self Care | Source: Home / Self Care

## 2024-11-22 DIAGNOSIS — R030 Elevated blood-pressure reading, without diagnosis of hypertension: Secondary | ICD-10-CM | POA: Diagnosis not present

## 2024-11-22 DIAGNOSIS — E860 Dehydration: Secondary | ICD-10-CM | POA: Diagnosis not present

## 2024-11-22 DIAGNOSIS — A084 Viral intestinal infection, unspecified: Secondary | ICD-10-CM

## 2024-11-22 DIAGNOSIS — R112 Nausea with vomiting, unspecified: Secondary | ICD-10-CM | POA: Diagnosis not present

## 2024-11-22 DIAGNOSIS — Z8679 Personal history of other diseases of the circulatory system: Secondary | ICD-10-CM | POA: Diagnosis not present

## 2024-11-22 LAB — POCT URINE DIPSTICK
Blood, UA: NEGATIVE
Glucose, UA: NEGATIVE mg/dL
Leukocytes, UA: NEGATIVE
Nitrite, UA: NEGATIVE
Protein Ur, POC: 100 mg/dL — AB
Spec Grav, UA: 1.02 (ref 1.010–1.025)
Urobilinogen, UA: 2 U/dL — AB
pH, UA: 6.5 (ref 5.0–8.0)

## 2024-11-22 MED ORDER — SODIUM CHLORIDE 0.9 % IV BOLUS
1000.0000 mL | Freq: Once | INTRAVENOUS | Status: AC
  Administered 2024-11-22: 13:00:00 1000 mL via INTRAVENOUS

## 2024-11-22 MED ORDER — METOCLOPRAMIDE HCL 5 MG/ML IJ SOLN
10.0000 mg | Freq: Once | INTRAMUSCULAR | Status: AC
  Administered 2024-11-22: 13:00:00 10 mg via INTRAVENOUS

## 2024-11-22 MED ORDER — DICYCLOMINE HCL 20 MG PO TABS: 20.0000 mg | ORAL_TABLET | Freq: Four times a day (QID) | ORAL | 0 refills | Status: DC | PRN

## 2024-11-22 MED ORDER — METOCLOPRAMIDE HCL 5 MG PO TABS: 5.0000 mg | ORAL_TABLET | Freq: Three times a day (TID) | ORAL | 0 refills | Status: AC

## 2024-11-22 NOTE — ED Triage Notes (Signed)
 Patient presents to [facility] for intermittent abdominal cramping, vomiting and loss of appetite x 2 days.   Treatment: No OTC meds.

## 2024-11-22 NOTE — Discharge Instructions (Signed)
 You were seen today for nausea, vomiting, diarrhea, abdominal cramping, body aches, and weakness. Your symptoms are most consistent with a stomach virus, which has also caused dehydration. Your urine test showed signs that your body was not getting enough fluids, and you were given IV fluids and medication in the clinic to help with nausea and cramping. You were able to tolerate fluids by mouth before discharge and are stable to continue care at home.  Over the next few days, focus on staying well hydrated. Take small, frequent sips of fluids such as water, electrolyte drinks, clear broths, or ice chips. Avoid soda if possible, as it can worsen dehydration and stomach upset. Eat a bland diet as tolerated, including foods such as crackers, toast, rice, bananas, or applesauce. Avoid dairy, fried, greasy, spicy, or heavy foods until your symptoms have completely resolved. Take the prescribed Bentyl as needed for abdominal cramping, and use nausea medication as directed. Once you are able to keep food and fluids down, resume your blood pressure medications as instructed by your primary care provider.  Follow up with your primary care provider within the next few days, especially to recheck your blood pressure and ensure your symptoms and hydration status are improving. Seek emergency care immediately if you are unable to keep fluids down, have ongoing vomiting or diarrhea, develop severe or worsening abdominal pain, experience dizziness or fainting, notice blood in your vomit or stools, develop a high fever, have very little or no urine output, or if your symptoms worsen or do not improve.

## 2024-11-22 NOTE — ED Provider Notes (Signed)
 [facility]    CSN: [id number] Arrival date & time: 11/22/24  1037      History   Chief Complaint Chief Complaint  Patient presents with   Emesis    HPI Eugene [name] is a 58 y.o. male.   Patient presents for evaluation of cramping, vomiting, diarrhea, and myalgias. Onset of symptoms was abrupt starting 2 days ago, and has been unchanged since that time.  His appetite is decreased.  He is able to keep just a little bit of diet Coke down.  He denies any hematemesis, melena or bloody stools.  No urinary symptoms.  He feels generally weak and tired.  No fevers.  He ate at a diner the morning prior to symptom onset and had dinner that was cooked at home the evening prior to symptom onset.  His wife ate dinner with him at home and is without any symptoms.  There is no prior history of gastrointestinal disease. No known risk factors for parasitic or bacterial infection.  He has a history of hypertension and is typically compliant with his antihypertensive therapy however he has been unable to take his medications the past 2 days due to his symptoms.  The following sections of the patient's history were reviewed and updated as appropriate: allergies, current medications, past family history, past medical history, past social history, past surgical history, and problem list.      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 07/25/2024   Alopecia 07/05/2023   Gastroesophageal reflux disease 07/05/2023   Gouty arthritis of right foot 07/05/2023   Mixed hyperlipidemia 07/05/2023   Erectile  dysfunction 07/05/2023   Thyroid  nodule 07/05/2023   Hypotestosteronism 07/05/2023   Vitamin D  deficiency 07/05/2023   Obesity 07/05/2023   Right thyroid  nodule 07/05/2023   Reversed sleep wake cycle 07/05/2023   Essential hypertension 07/04/2023   Abnormal gait 01/25/2022   Knee pain 01/25/2022   Knee stiff 01/25/2022   Rupture of quadriceps tendon 01/25/2022   Obstructive sleep apnea syndrome 07/10/2018   Rupture of biceps tendon 06/12/2018   Shoulder strain 06/12/2018    Past Surgical History:  Procedure Laterality Date   knee sugery Right    KNEE SURGERY Left    5/26 and 6/25   left elbow surgery     LEG SURGERY Left    X2 times   SHOULDER ARTHROSCOPY Right 08/21/2009   rotator cuff repair       Home Medications    Prior to Admission medications  Medication Sig Start Date End Date Taking? Authorizing Provider  dicyclomine (BENTYL) 20 MG tablet Take 1 tablet (20 mg total) by mouth 4 (four) times daily as needed (abdominal discomfort). 11/22/24  Yes Khelani Kops, FNP  metoCLOPramide  (REGLAN ) 5 MG tablet Take 1 tablet (5 mg total) by mouth 3 (three) times daily before meals. 11/22/24 11/27/24 Yes Iola Lukes, FNP  albuterol  (VENTOLIN  HFA) 108 (90 Base) MCG/ACT inhaler Inhale 1-2 puffs into the lungs every 6 (six) hours as  needed for wheezing or shortness of breath. 01/31/24   Arvis Huxley B, PA-C  allopurinol (ZYLOPRIM) 300 MG tablet Take 300 mg by mouth daily.    [provider]  carvedilol  (COREG ) 25 MG tablet Take 1 tablet (25 mg total) by mouth 2 (two) times daily with a meal. 08/27/24   Manya Toribio SQUIBB, PA  ezetimibe  (ZETIA ) 10 MG tablet Take 1 tablet (10 mg total) by mouth daily. 07/25/24   Manya Toribio SQUIBB, PA  finasteride (PROPECIA) 1 MG tablet Take 1 tablet by mouth daily.    [provider]  fluticasone  (FLONASE ) 50 MCG/ACT nasal spray SPRAY 1 SPRAY INTO BOTH NOSTRILS DAILY. 09/06/23   Manya Toribio SQUIBB, PA  gemfibrozil  (LOPID ) 600  MG tablet Take 1 tablet (600 mg total) by mouth 2 (two) times daily before a meal. 08/27/24   Manya Toribio SQUIBB, PA  hydrocortisone 2.5 % cream as needed.    [provider]  ipratropium (ATROVENT ) 0.06 % nasal spray Place 2 sprays into both nostrils 4 (four) times daily. 09/07/24   [provider]  levothyroxine  (SYNTHROID ) 75 MCG tablet Take 1 tablet (75 mcg total) by mouth daily before breakfast. Take on empty stomach, first thing in the morning, full glass of water, 30 min before anything else 07/12/23   Manya Toribio SQUIBB, PA  lisinopril -hydrochlorothiazide  (ZESTORETIC ) 20-25 MG tablet TAKE 1 TABLET BY MOUTH EVERY DAY 11/21/24   Manya Toribio SQUIBB, PA  minoxidil (LONITEN) 2.5 MG tablet Take 2.5 mg by mouth daily. 01/20/22   [provider]  Multiple Vitamin (MULTIVITAMIN) capsule Take 1 capsule by mouth daily.    [provider]  Omega-3 Fatty Acids (FISH OIL PO) Take by mouth.    [provider]  testosterone  cypionate (DEPOTESTOSTERONE CYPIONATE) 200 MG/ML injection Inject 1 mL (200 mg total) into the muscle every 14 (fourteen) days. Store at room temperature in a dark place 03/21/24   Manya Toribio SQUIBB, PA  triamcinolone  cream (KENALOG) 0.1 % SMARTSIG:1 Application Topical 2-3 Times Daily    [provider]  Vitamin D , Ergocalciferol , (DRISDOL ) 1.25 MG (50000 UNIT) CAPS capsule TAKE 1 CAPSULE BY MOUTH ONE TIME PER WEEK 10/23/24   Manya Toribio SQUIBB, PA    Family History Family History  Problem Relation Age of Onset   Hypertension Father    Alzheimer's disease Father    Sarcoidosis Sister        sarcoidosis lungs   Other Brother        brain damage   Birth defects Brother    Birth defects Son     Social History Social History[1]   Allergies   Other, Statins, Atorvastatin calcium, Cat dander, Cat hair extract, and Atorvastatin   Review of Systems Review of Systems  Constitutional:  Positive for appetite change (decreaed) and  fatigue. Negative for fever.  Gastrointestinal:  Positive for abdominal pain (cramping), diarrhea and vomiting.  Neurological:  Positive for weakness. Negative for headaches.  All other systems reviewed and are negative.    Physical Exam Triage Vital Signs ED Triage Vitals  Encounter Vitals Group     BP 11/22/24 1123 (S) (!) (P) 172/102     Girls Systolic BP Percentile --      Girls Diastolic BP Percentile --      Boys Systolic BP Percentile --      Boys Diastolic BP Percentile --      Pulse Rate 11/22/24 1123 (P) 62     Resp 11/22/24 1123 (P) 16  Temp 11/22/24 1123 (P) 99.4 F (37.4 C)     Temp Source 11/22/24 1123 (P) Oral     SpO2 11/22/24 1123 (P) 97 %     Weight --      Height --      Head Circumference --      Peak Flow --      Pain Score 11/22/24 1122 7     Pain Loc --      Pain Education --      Exclude from Growth Chart --    No data found.  Updated Vital Signs BP (S) (!) (P) 172/102 (BP Location: Left Arm) Comment: pt did not take his bp med today  Pulse (P) 62   Temp (P) 99.4 F (37.4 C) (Oral)   Resp (P) 16   SpO2 (P) 97%   Visual Acuity Right Eye Distance:   Left Eye Distance:   Bilateral Distance:    Right Eye Near:   Left Eye Near:    Bilateral Near:     Physical Exam Vitals reviewed.  Constitutional:      General: He is awake. He is not in acute distress.    Appearance: Normal appearance. He is well-developed. He is not ill-appearing, toxic-appearing or diaphoretic.  HENT:     Head: Normocephalic.     Right Ear: Hearing normal.     Left Ear: Hearing normal.     Nose: Nose normal.     Mouth/Throat:     Mouth: Mucous membranes are moist.  Eyes:     General: Vision grossly intact.     Conjunctiva/sclera: Conjunctivae normal.  Cardiovascular:     Rate and Rhythm: Normal rate and regular rhythm.     Heart sounds: Normal heart sounds.  Pulmonary:     Effort: Pulmonary effort is normal.     Breath sounds: Normal breath sounds and air  entry.  Abdominal:     General: Bowel sounds are normal. There is no distension.     Palpations: Abdomen is soft.     Tenderness: There is abdominal tenderness in the epigastric area. There is no right CVA tenderness, left CVA tenderness, guarding or rebound.  Musculoskeletal:        General: Normal range of motion.     Cervical back: Normal range of motion and neck supple.  Skin:    General: Skin is warm and dry.  Neurological:     General: No focal deficit present.     Mental Status: He is alert and oriented to person, place, and time.  Psychiatric:        Speech: Speech normal.        Behavior: Behavior is cooperative.      UC Treatments / Results  Labs (all labs ordered are listed, but only abnormal results are displayed) Labs Reviewed  POCT URINE DIPSTICK - Abnormal; Notable for the following components:      Result Value   Color, UA orange (*)    Bilirubin, UA small (*)    Ketones, POC UA moderate (40) (*)    Protein Ur, POC =100 (*)    Urobilinogen, UA 2.0 (*)    All other components within normal limits    EKG   Radiology No results found.  Procedures Procedures (including critical care time)  Medications Ordered in UC Medications  sodium chloride  0.9 % bolus 1,000 mL (0 mLs Intravenous Stopped 11/22/24 1405)  metoCLOPramide  (REGLAN ) injection 10 mg (10 mg Intravenous Given 11/22/24 1258)  Initial Impression / Assessment and Plan / UC Course  I have reviewed the triage vital signs and the nursing notes.  Pertinent labs & imaging results that were available during my care of the patient were reviewed by me and considered in my medical decision making (see chart for details).     The patient presents with a two-day history of abrupt-onset cramping abdominal pain, vomiting, diarrhea, myalgias, decreased appetite, and generalized weakness. He reports being able to tolerate only small amounts of oral intake, primarily diet soda, and denies hematemesis,  melena, hematochezia, or urinary symptoms. He has no prior history of gastrointestinal disease and no known risk factors for parasitic or bacterial infection. Recent dietary history includes eating at a diner the morning prior to symptom onset and a home-cooked dinner the evening before symptoms began; his wife consumed the same home-prepared meal and remains asymptomatic. He reports inability to take his antihypertensive medications over the past two days due to ongoing vomiting.  On evaluation, he is noted to have a low-grade temperature of 99.8F and is hypertensive at 172/102, likely related to missed antihypertensive doses and acute illness. Remaining vital signs are within normal limits. Physical examination is unremarkable, and the patient appears nontoxic and in no acute distress. Urinalysis demonstrates orange-colored urine with moderate ketones and small bilirubin and urobilinogen, without evidence of infection or hematuria, consistent with dehydration. Overall presentation is most consistent with acute viral gastroenteritis complicated by moderate dehydration.  The patient was treated with one liter of normal saline intravenously and Reglan  for nausea and abdominal cramping, with improvement in symptoms. He successfully tolerated an oral challenge and remained clinically stable. He was discharged home with a prescription for Bentyl as needed for abdominal cramping and advised to aggressively increase oral fluid intake, resume a bland diet as tolerated, and avoid dairy, greasy, fried, heavy, and spicy foods. He was instructed to follow up with his primary care provider for blood pressure reassessment and medication management once able to tolerate oral intake, and to seek emergency care for worsening or persistent vomiting, inability to maintain hydration, increasing abdominal pain, fever, bloody stools or emesis, dizziness, syncope, or any other concerning symptoms.  Today's evaluation has revealed  no signs of a dangerous process. Discussed diagnosis with patient and/or guardian. Patient and/or guardian aware of their diagnosis, possible red flag symptoms to watch out for and need for close follow up. Patient and/or guardian understands verbal and written discharge instructions. Patient and/or guardian comfortable with plan and disposition.  Patient and/or guardian has a clear mental status at this time, good insight into illness (after discussion and teaching) and has clear judgment to make decisions regarding their care  Documentation was completed with the aid of voice recognition software. Transcription may contain typographical errors.  Final Clinical Impressions(s) / UC Diagnoses   Final diagnoses:  Nausea and vomiting, unspecified vomiting type  Viral gastroenteritis  Moderate dehydration  Elevated blood pressure reading  History of hypertension     Discharge Instructions      You were seen today for nausea, vomiting, diarrhea, abdominal cramping, body aches, and weakness. Your symptoms are most consistent with a stomach virus, which has also caused dehydration. Your urine test showed signs that your body was not getting enough fluids, and you were given IV fluids and medication in the clinic to help with nausea and cramping. You were able to tolerate fluids by mouth before discharge and are stable to continue care at home.  Over the next few days,  focus on staying well hydrated. Take small, frequent sips of fluids such as water, electrolyte drinks, clear broths, or ice chips. Avoid soda if possible, as it can worsen dehydration and stomach upset. Eat a bland diet as tolerated, including foods such as crackers, toast, rice, bananas, or applesauce. Avoid dairy, fried, greasy, spicy, or heavy foods until your symptoms have completely resolved. Take the prescribed Bentyl as needed for abdominal cramping, and use nausea medication as directed. Once you are able to keep food and fluids  down, resume your blood pressure medications as instructed by your primary care provider.  Follow up with your primary care provider within the next few days, especially to recheck your blood pressure and ensure your symptoms and hydration status are improving. Seek emergency care immediately if you are unable to keep fluids down, have ongoing vomiting or diarrhea, develop severe or worsening abdominal pain, experience dizziness or fainting, notice blood in your vomit or stools, develop a high fever, have very little or no urine output, or if your symptoms worsen or do not improve.      ED Prescriptions     Medication Sig Dispense Auth. Provider   metoCLOPramide  (REGLAN ) 5 MG tablet Take 1 tablet (5 mg total) by mouth 3 (three) times daily before meals. 15 tablet Iola Lukes, FNP   dicyclomine (BENTYL) 20 MG tablet Take 1 tablet (20 mg total) by mouth 4 (four) times daily as needed (abdominal discomfort). 20 tablet Iola Lukes, FNP      PDMP not reviewed this encounter.      [1]  Social History Tobacco Use   Smoking status: Former    Current packs/day: 0.00    Types: Cigarettes    Quit date: 12/07/1991    Years since quitting: 32.9   Smokeless tobacco: Never  Vaping Use   Vaping status: Never Used  Substance Use Topics   Alcohol use: Yes    Alcohol/week: 7.0 standard drinks of alcohol    Types: 2 Glasses of wine, 5 Cans of beer per week    Comment: beer 1-2 a week   Drug use: No     Iola Lukes, FNP 11/22/24 1407

## 2024-11-27 ENCOUNTER — Other Ambulatory Visit: Payer: Self-pay | Admitting: Physician Assistant

## 2024-11-27 ENCOUNTER — Ambulatory Visit: Admitting: Internal Medicine

## 2024-11-27 ENCOUNTER — Encounter: Payer: Self-pay | Admitting: Internal Medicine

## 2024-11-27 VITALS — BP 145/85 | HR 64 | Temp 98.0°F | Resp 16 | Ht 72.0 in | Wt 247.0 lb

## 2024-11-27 DIAGNOSIS — E291 Testicular hypofunction: Secondary | ICD-10-CM

## 2024-11-27 DIAGNOSIS — G4733 Obstructive sleep apnea (adult) (pediatric): Secondary | ICD-10-CM | POA: Diagnosis not present

## 2024-11-27 DIAGNOSIS — Z7189 Other specified counseling: Secondary | ICD-10-CM

## 2024-11-27 DIAGNOSIS — R0602 Shortness of breath: Secondary | ICD-10-CM | POA: Diagnosis not present

## 2024-11-27 NOTE — Patient Instructions (Signed)

## 2024-11-27 NOTE — Progress Notes (Signed)
 [facility] [address] [place], [place] [address]  Pulmonary Sleep Medicine   Office Visit Note  Patient Name: Eugene [name] DOB: 27-Nov-1966 MRN [id number]  Date of Service: 11/27/2024  Complaints/HPI: OSA overall he is doing relatively well.  He has not been able to lose the weight as much as he would like to.  He has been having some better other nonpulmonary issues that have been limiting him from being able to lose weight.  He states that he has been going back to the gym  Office Spirometry Results:     ROS  General: (-) fever, (-) chills, (-) night sweats, (-) weakness Skin: (-) rashes, (-) itching,. Eyes: (-) visual changes, (-) redness, (-) itching. Nose and Sinuses: (-) nasal stuffiness or itchiness, (-) postnasal drip, (-) nosebleeds, (-) sinus trouble. Mouth and Throat: (-) sore throat, (-) hoarseness. Neck: (-) swollen glands, (-) enlarged thyroid , (-) neck pain. Respiratory: - cough, (-) bloody sputum, - shortness of breath, - wheezing. Cardiovascular: - ankle swelling, (-) chest pain. Lymphatic: (-) lymph node enlargement. Neurologic: (-) numbness, (-) tingling. Psychiatric: (-) anxiety, (-) depression   Current Medication: Outpatient Encounter Medications as of 11/27/2024  Medication Sig   albuterol  (VENTOLIN  HFA) 108 (90 Base) MCG/ACT inhaler Inhale 1-2 puffs into the lungs every 6 (six) hours as needed for wheezing or shortness of breath.   allopurinol (ZYLOPRIM) 300 MG tablet Take 300 mg by mouth daily.   carvedilol  (COREG ) 25 MG tablet Take 1 tablet (25 mg total) by mouth 2 (two) times daily with a meal.   dicyclomine (BENTYL) 20 MG tablet Take 1 tablet (20 mg total) by mouth 4 (four) times daily as needed (abdominal discomfort).   ezetimibe  (ZETIA ) 10 MG tablet Take 1 tablet (10 mg total) by mouth daily.   finasteride (PROPECIA) 1 MG tablet Take 1 tablet by mouth daily.   fluticasone  (FLONASE ) 50 MCG/ACT nasal spray SPRAY 1 SPRAY INTO  BOTH NOSTRILS DAILY.   gemfibrozil  (LOPID ) 600 MG tablet Take 1 tablet (600 mg total) by mouth 2 (two) times daily before a meal.   hydrocortisone 2.5 % cream as needed.   ipratropium (ATROVENT ) 0.06 % nasal spray Place 2 sprays into both nostrils 4 (four) times daily.   levothyroxine  (SYNTHROID ) 75 MCG tablet Take 1 tablet (75 mcg total) by mouth daily before breakfast. Take on empty stomach, first thing in the morning, full glass of water, 30 min before anything else   lisinopril -hydrochlorothiazide  (ZESTORETIC ) 20-25 MG tablet TAKE 1 TABLET BY MOUTH EVERY DAY   metoCLOPramide  (REGLAN ) 5 MG tablet Take 1 tablet (5 mg total) by mouth 3 (three) times daily before meals.   minoxidil (LONITEN) 2.5 MG tablet Take 2.5 mg by mouth daily.   Multiple Vitamin (MULTIVITAMIN) capsule Take 1 capsule by mouth daily.   Omega-3 Fatty Acids (FISH OIL PO) Take by mouth.   testosterone  cypionate (DEPOTESTOSTERONE CYPIONATE) 200 MG/ML injection Inject 1 mL (200 mg total) into the muscle every 14 (fourteen) days. Store at room temperature in a dark place   triamcinolone  cream (KENALOG) 0.1 % SMARTSIG:1 Application Topical 2-3 Times Daily   Vitamin D , Ergocalciferol , (DRISDOL ) 1.25 MG (50000 UNIT) CAPS capsule TAKE 1 CAPSULE BY MOUTH ONE TIME PER WEEK   No facility-administered encounter medications on file as of 11/27/2024.    Surgical History: Past Surgical History:  Procedure Laterality Date   knee sugery Right    KNEE SURGERY Left    5/26 and 6/25   left  elbow surgery     LEG SURGERY Left    X2 times   SHOULDER ARTHROSCOPY Right 08/21/2009   rotator cuff repair    Medical History: Past Medical History:  Diagnosis Date   Allergy 2002   Pet dander, American roach, grass   Arthritis 2011   Edema    Gout    Hair loss    Hyperlipidemia    Hypertension 2008   Daily meds   Sleep apnea 2010   Use C-pap machine every day   Thyroid  disease 2008   Take synthroid  daily   Vitamin D  deficiency      Family History: Family History  Problem Relation Age of Onset   Hypertension Father    Alzheimer's disease Father    Sarcoidosis Sister        sarcoidosis lungs   Other Brother        brain damage   Birth defects Brother    Birth defects Son     Social History: Social History   Socioeconomic History   Marital status: Married    Spouse name: Not on file   Number of children: Not on file   Years of education: Not on file   Highest education level: Bachelor's degree (e.g., BA, AB, BS)  Occupational History   Not on file  Tobacco Use   Smoking status: Former    Current packs/day: 0.00    Types: Cigarettes    Quit date: 12/07/1991    Years since quitting: 32.9   Smokeless tobacco: Never  Vaping Use   Vaping status: Never Used  Substance and Sexual Activity   Alcohol use: Yes    Alcohol/week: 7.0 standard drinks of alcohol    Types: 2 Glasses of wine, 5 Cans of beer per week    Comment: beer 1-2 a week   Drug use: No   Sexual activity: Yes    Birth control/protection: None  Other Topics Concern   Not on file  Social History Narrative   Not on file   Social Drivers of Health   Tobacco Use: Medium Risk (11/27/2024)   Patient History    Smoking Tobacco Use: Former    Smokeless Tobacco Use: Never    Passive Exposure: Not on file  Financial Resource Strain: Low Risk (07/25/2024)   Overall Financial Resource Strain (CARDIA)    Difficulty of Paying Living Expenses: Not hard at all  Food Insecurity: No Food Insecurity (07/25/2024)   Epic    Worried About Radiation Protection Practitioner of Food in the Last Year: Never true    Ran Out of Food in the Last Year: Never true  Transportation Needs: No Transportation Needs (07/25/2024)   Epic    Lack of Transportation (Medical): No    Lack of Transportation (Non-Medical): No  Physical Activity: Sufficiently Active (07/25/2024)   Exercise Vital Sign    Days of Exercise per Week: 4 days    Minutes of Exercise per Session: 60 min  Stress: No  Stress Concern Present (07/25/2024)   [employer] of Occupational Health - Occupational Stress Questionnaire    Feeling of Stress: Not at all  Social Connections: Moderately Integrated (07/25/2024)   Social Connection and Isolation Panel    Frequency of Communication with Friends and Family: More than three times a week    Frequency of Social Gatherings with Friends and Family: Three times a week    Attends Religious Services: 1 to 4 times per year    Active Member of Clubs or Organizations:  No    Attends Banker Meetings: Not on file    Marital Status: Married  Intimate Partner Violence: Not At Risk (12/29/2023)   Humiliation, Afraid, Rape, and Kick questionnaire    Fear of Current or Ex-Partner: No    Emotionally Abused: No    Physically Abused: No    Sexually Abused: No  Depression (PHQ2-9): Low Risk (09/26/2024)   Depression (PHQ2-9)    PHQ-2 Score: 0  Alcohol Screen: Low Risk (07/25/2024)   Alcohol Screen    Last Alcohol Screening Score (AUDIT): 4  Housing: Low Risk (07/25/2024)   Epic    Unable to Pay for Housing in the Last Year: No    Number of Times Moved in the Last Year: 0    Homeless in the Last Year: No  Utilities: Not At Risk (12/29/2023)   AHC Utilities    Threatened with loss of utilities: No  Health Literacy: Not on file    Vital Signs: Blood pressure (!) 145/85, pulse 64, temperature 98 F (36.7 C), resp. rate 16, height 6' (1.829 m), weight 247 lb (112 kg), SpO2 96%.  Examination: General Appearance: The patient is well-developed, well-nourished, and in no distress. Skin: Gross inspection of skin unremarkable. Head: normocephalic, no gross deformities. Eyes: no gross deformities noted. ENT: ears appear grossly normal no exudates. Neck: Supple. No thyromegaly. No LAD. Respiratory: no rhonchi noted. Cardiovascular: Normal S1 and S2 without murmur or rub. Extremities: No cyanosis. pulses are equal. Neurologic: Alert and oriented. No  involuntary movements.  LABS: Recent Results (from the past 2160 hours)  POC Urinalysis Dipstick     Status: Abnormal   Collection Time: 11/22/24 12:36 PM  Result Value Ref Range   Color, UA orange (A) yellow   Clarity, UA clear clear   Glucose, UA negative negative mg/dL   Bilirubin, UA small (A) negative   Ketones, POC UA moderate (40) (A) negative mg/dL   Spec Grav, UA 8.979 8.989 - 1.025   Blood, UA negative negative   pH, UA 6.5 5.0 - 8.0   Protein Ur, POC =100 (A) negative mg/dL   Urobilinogen, UA 2.0 (A) 0.2 or 1.0 E.U./dL   Nitrite, UA Negative Negative   Leukocytes, UA Negative Negative    Radiology: No results found.  No results found.  No results found.  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 07/25/2024   Alopecia 07/05/2023   Gastroesophageal reflux disease 07/05/2023   Gouty arthritis of right foot 07/05/2023   Mixed hyperlipidemia 07/05/2023   Erectile dysfunction 07/05/2023   Thyroid  nodule 07/05/2023   Hypotestosteronism 07/05/2023   Vitamin D  deficiency 07/05/2023   Obesity 07/05/2023   Right thyroid  nodule 07/05/2023   Reversed sleep wake cycle 07/05/2023   Essential hypertension 07/04/2023   Abnormal gait 01/25/2022   Knee pain 01/25/2022   Knee stiff 01/25/2022   Rupture of quadriceps tendon 01/25/2022   Obstructive sleep apnea syndrome 07/10/2018   Rupture of biceps tendon 06/12/2018   Shoulder strain 06/12/2018    1. OSA (obstructive sleep apnea) (Primary) Continue with PAP therapy prescription written for his CPAP pressure attachments and supplies.  He does have issues with shortness of breath breathing and so therefore we will get pulmonary functions done - Pulmonary function test; Future - For home use only DME continuous positive airway pressure (CPAP)  2. CPAP use counseling CPAP Counseling: had a lengthy discussion with the patient  regarding the importance of PAP therapy in management of the sleep apnea. Patient appears to understand the risk factor reduction and also understands the risks associated with untreated sleep apnea.    General Counseling: I have discussed the findings of the evaluation and examination with Lynwood.  I have also discussed any further diagnostic evaluation thatmay be needed or ordered today. Hamdan verbalizes understanding of the findings of todays visit. We also reviewed his medications today and discussed drug interactions and side effects including but not limited excessive drowsiness and altered mental states. We also discussed that there is always a risk not just to him but also people around him. he has been encouraged to call the office with any questions or concerns that should arise related to todays visit.  No orders of the defined types were placed in this encounter.    Time spent: 101  I have personally obtained a history, examined the patient, evaluated laboratory and imaging results, formulated the assessment and plan and placed orders.    Elfreda DELENA Bathe, MD [facility] Pulmonary and Critical Care Sleep medicine

## 2024-11-28 NOTE — Telephone Encounter (Signed)
 Requested medication (s) are due for refill today: Yes  Requested medication (s) are on the active medication list: Yes  Last refill:  03/21/24  Future visit scheduled: Yes  Notes to clinic:  Unable to refill due to no refill protocol for this medication.      Requested Prescriptions  Pending Prescriptions Disp Refills   testosterone  cypionate (DEPOTESTOSTERONE CYPIONATE) 200 MG/ML injection [Pharmacy Med Name: TESTOSTERONE  CYP 200 MG/ML] 6 mL     Sig: INJECT 1 ML INTO THE MUSCLE EVERY 14 (FOURTEEN) DAYS. STORE AT ROOM TEMPERATURE IN A DARK PLACE     Off-Protocol Failed - 11/28/2024  2:16 PM      Failed - Medication not assigned to a protocol, review manually.      Passed - Valid encounter within last 12 months    Recent Outpatient Visits           2 months ago Essential hypertension   [facility] at [facility], [place], [place]   3 months ago Essential hypertension   [facility] at [facility], [place], [place]   4 months ago Annual physical exam   [facility] at [facility], [place], [place]

## 2024-12-03 NOTE — Telephone Encounter (Signed)
 Please review.  KP

## 2024-12-03 NOTE — Telephone Encounter (Signed)
 Please review and refill if appropriate  JM

## 2024-12-12 ENCOUNTER — Ambulatory Visit: Admitting: Internal Medicine

## 2024-12-12 DIAGNOSIS — G4733 Obstructive sleep apnea (adult) (pediatric): Secondary | ICD-10-CM

## 2024-12-12 DIAGNOSIS — R0602 Shortness of breath: Secondary | ICD-10-CM | POA: Diagnosis not present

## 2024-12-18 ENCOUNTER — Ambulatory Visit: Payer: BC Managed Care – PPO | Admitting: Nurse Practitioner

## 2024-12-20 NOTE — Procedures (Signed)
 [facility] [address] [place], [address]    Complete Pulmonary Function Testing Interpretation:  FINDINGS:  Forced vital capacity is mildly decreased.  FEV1 is 2.54 L which is 66% of predicted and is mildly decreased.  FEV1 FVC ratio is decreased.  Postbronchodilator no significant change in FEV1.  Total lung capacity was mildly decreased residual volume is decreased FRC is decreased.  DLCO is normal.  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

## 2024-12-24 LAB — PULMONARY FUNCTION TEST

## 2024-12-25 ENCOUNTER — Ambulatory Visit: Admitting: Internal Medicine

## 2024-12-25 ENCOUNTER — Encounter: Payer: Self-pay | Admitting: Internal Medicine

## 2024-12-25 VITALS — BP 144/94 | HR 64 | Temp 98.0°F | Resp 16 | Ht 72.0 in | Wt 247.0 lb

## 2024-12-25 DIAGNOSIS — G4733 Obstructive sleep apnea (adult) (pediatric): Secondary | ICD-10-CM

## 2024-12-25 DIAGNOSIS — J984 Other disorders of lung: Secondary | ICD-10-CM

## 2024-12-25 NOTE — Progress Notes (Unsigned)
 [facility] [address] [place], [place] [address]  Pulmonary Sleep Medicine   Office Visit Note  Patient Name: Eugene [name] DOB: March 14, 1966 MRN [id number]  Date of Service: 12/25/2024  Complaints/HPI: He had PFt done and this shows presence of mild restriction. This may be due to weight but cannot rule out ILD.   Office Spirometry Results:     ROS  General: (-) fever, (-) chills, (-) night sweats, (-) weakness Skin: (-) rashes, (-) itching,. Eyes: (-) visual changes, (-) redness, (-) itching. Nose and Sinuses: (-) nasal stuffiness or itchiness, (-) postnasal drip, (-) nosebleeds, (-) sinus trouble. Mouth and Throat: (-) sore throat, (-) hoarseness. Neck: (-) swollen glands, (-) enlarged thyroid , (-) neck pain. Respiratory: - cough, (-) bloody sputum, - shortness of breath, - wheezing. Cardiovascular: - ankle swelling, (-) chest pain. Lymphatic: (-) lymph node enlargement. Neurologic: (-) numbness, (-) tingling. Psychiatric: (-) anxiety, (-) depression   Current Medication: Outpatient Encounter Medications as of 12/25/2024  Medication Sig   albuterol  (VENTOLIN  HFA) 108 (90 Base) MCG/ACT inhaler Inhale 1-2 puffs into the lungs every 6 (six) hours as needed for wheezing or shortness of breath.   allopurinol (ZYLOPRIM) 300 MG tablet Take 300 mg by mouth daily.   carvedilol  (COREG ) 25 MG tablet Take 1 tablet (25 mg total) by mouth 2 (two) times daily with a meal.   ezetimibe  (ZETIA ) 10 MG tablet Take 1 tablet (10 mg total) by mouth daily.   finasteride (PROPECIA) 1 MG tablet Take 1 tablet by mouth daily.   fluticasone  (FLONASE ) 50 MCG/ACT nasal spray SPRAY 1 SPRAY INTO BOTH NOSTRILS DAILY.   gemfibrozil  (LOPID ) 600 MG tablet Take 1 tablet (600 mg total) by mouth 2 (two) times daily before a meal.   hydrocortisone 2.5 % cream as needed.   ipratropium (ATROVENT ) 0.06 % nasal spray Place 2 sprays into both nostrils 4 (four) times daily.   levothyroxine   (SYNTHROID ) 75 MCG tablet Take 1 tablet (75 mcg total) by mouth daily before breakfast. Take on empty stomach, first thing in the morning, full glass of water, 30 min before anything else   lisinopril -hydrochlorothiazide  (ZESTORETIC ) 20-25 MG tablet TAKE 1 TABLET BY MOUTH EVERY DAY   metoCLOPramide  (REGLAN ) 5 MG tablet Take 1 tablet (5 mg total) by mouth 3 (three) times daily before meals.   minoxidil (LONITEN) 2.5 MG tablet Take 2.5 mg by mouth daily.   Multiple Vitamin (MULTIVITAMIN) capsule Take 1 capsule by mouth daily.   Omega-3 Fatty Acids (FISH OIL PO) Take by mouth.   testosterone  cypionate (DEPOTESTOSTERONE CYPIONATE) 200 MG/ML injection INJECT 1 ML INTO THE MUSCLE EVERY 14 (FOURTEEN) DAYS. STORE AT ROOM TEMPERATURE IN A DARK PLACE   triamcinolone  cream (KENALOG) 0.1 % SMARTSIG:1 Application Topical 2-3 Times Daily   Vitamin D , Ergocalciferol , (DRISDOL ) 1.25 MG (50000 UNIT) CAPS capsule TAKE 1 CAPSULE BY MOUTH ONE TIME PER WEEK   [DISCONTINUED] dicyclomine  (BENTYL ) 20 MG tablet Take 1 tablet (20 mg total) by mouth 4 (four) times daily as needed (abdominal discomfort).   No facility-administered encounter medications on file as of 12/25/2024.    Surgical History: Past Surgical History:  Procedure Laterality Date   knee sugery Right    KNEE SURGERY Left    5/26 and 6/25   left elbow surgery     LEG SURGERY Left    X2 times   SHOULDER ARTHROSCOPY Right 08/21/2009   rotator cuff repair    Medical History: Past Medical History:  Diagnosis Date  Allergy 2002   Pet dander, American roach, grass   Arthritis 2011   Edema    Gout    Hair loss    Hyperlipidemia    Hypertension 2008   Daily meds   Sleep apnea 2010   Use C-pap machine every day   Thyroid  disease 2008   Take synthroid  daily   Vitamin D  deficiency     Family History: Family History  Problem Relation Age of Onset   Hypertension Father    Alzheimer's disease Father    Sarcoidosis Sister        sarcoidosis  lungs   Other Brother        brain damage   Birth defects Brother    Birth defects Son     Social History: Social History   Socioeconomic History   Marital status: Married    Spouse name: Not on file   Number of children: Not on file   Years of education: Not on file   Highest education level: Bachelor's degree (e.g., BA, AB, BS)  Occupational History   Not on file  Tobacco Use   Smoking status: Former    Current packs/day: 0.00    Types: Cigarettes    Quit date: 12/07/1991    Years since quitting: 33.0   Smokeless tobacco: Never  Vaping Use   Vaping status: Never Used  Substance and Sexual Activity   Alcohol use: Yes    Alcohol/week: 7.0 standard drinks of alcohol    Types: 2 Glasses of wine, 5 Cans of beer per week    Comment: beer 1-2 a week   Drug use: No   Sexual activity: Yes    Birth control/protection: None  Other Topics Concern   Not on file  Social History Narrative   Not on file   Social Drivers of Health   Tobacco Use: Medium Risk (12/25/2024)   Patient History    Smoking Tobacco Use: Former    Smokeless Tobacco Use: Never    Passive Exposure: Not on Actuary Strain: Low Risk (07/25/2024)   Overall Financial Resource Strain (CARDIA)    Difficulty of Paying Living Expenses: Not hard at all  Food Insecurity: No Food Insecurity (07/25/2024)   Epic    Worried About Radiation Protection Practitioner of Food in the Last Year: Never true    Ran Out of Food in the Last Year: Never true  Transportation Needs: No Transportation Needs (07/25/2024)   Epic    Lack of Transportation (Medical): No    Lack of Transportation (Non-Medical): No  Physical Activity: Sufficiently Active (07/25/2024)   Exercise Vital Sign    Days of Exercise per Week: 4 days    Minutes of Exercise per Session: 60 min  Stress: No Stress Concern Present (07/25/2024)   [employer] of Occupational Health - Occupational Stress Questionnaire    Feeling of Stress: Not at all  Social  Connections: Moderately Integrated (07/25/2024)   Social Connection and Isolation Panel    Frequency of Communication with Friends and Family: More than three times a week    Frequency of Social Gatherings with Friends and Family: Three times a week    Attends Religious Services: 1 to 4 times per year    Active Member of Clubs or Organizations: No    Attends Banker Meetings: Not on file    Marital Status: Married  Intimate Partner Violence: Not At Risk (12/29/2023)   Humiliation, Afraid, Rape, and Kick questionnaire    Fear  of Current or Ex-Partner: No    Emotionally Abused: No    Physically Abused: No    Sexually Abused: No  Depression (PHQ2-9): Low Risk (09/26/2024)   Depression (PHQ2-9)    PHQ-2 Score: 0  Alcohol Screen: Low Risk (07/25/2024)   Alcohol Screen    Last Alcohol Screening Score (AUDIT): 4  Housing: Low Risk (07/25/2024)   Epic    Unable to Pay for Housing in the Last Year: No    Number of Times Moved in the Last Year: 0    Homeless in the Last Year: No  Utilities: Not At Risk (12/29/2023)   AHC Utilities    Threatened with loss of utilities: No  Health Literacy: Not on file    Vital Signs: Blood pressure (!) 144/94, pulse 64, temperature 98 F (36.7 C), resp. rate 16, height 6' (1.829 m), weight 247 lb (112 kg), SpO2 97%.  Examination: General Appearance: The patient is well-developed, well-nourished, and in no distress. Skin: Gross inspection of skin unremarkable. Head: normocephalic, no gross deformities. Eyes: no gross deformities noted. ENT: ears appear grossly normal no exudates. Neck: Supple. No thyromegaly. No LAD. Respiratory: no rhonchi noted. Cardiovascular: Normal S1 and S2 without murmur or rub. Extremities: No cyanosis. pulses are equal. Neurologic: Alert and oriented. No involuntary movements.  LABS: Recent Results (from the past 2160 hours)  POC Urinalysis Dipstick     Status: Abnormal   Collection Time: 11/22/24 12:36 PM   Result Value Ref Range   Color, UA orange (A) yellow   Clarity, UA clear clear   Glucose, UA negative negative mg/dL   Bilirubin, UA small (A) negative   Ketones, POC UA moderate (40) (A) negative mg/dL   Spec Grav, UA 8.979 8.989 - 1.025   Blood, UA negative negative   pH, UA 6.5 5.0 - 8.0   Protein Ur, POC =100 (A) negative mg/dL   Urobilinogen, UA 2.0 (A) 0.2 or 1.0 E.U./dL   Nitrite, UA Negative Negative   Leukocytes, UA Negative Negative  Pulmonary Function Test     Status: None   Collection Time: 12/24/24  3:12 PM  Result Value Ref Range   FEV1     FVC     FEV1/FVC     TLC     DLCO      Radiology: No results found.  No results found.  No results found.  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 07/25/2024   Alopecia 07/05/2023   Gastroesophageal reflux disease 07/05/2023   Gouty arthritis of right foot 07/05/2023   Mixed hyperlipidemia 07/05/2023   Erectile dysfunction 07/05/2023   Thyroid  nodule 07/05/2023   Hypotestosteronism 07/05/2023   Vitamin D  deficiency 07/05/2023   Obesity 07/05/2023   Right thyroid  nodule 07/05/2023   Reversed sleep wake cycle 07/05/2023   Essential hypertension 07/04/2023   Abnormal gait 01/25/2022   Knee pain 01/25/2022   Knee stiff 01/25/2022   Rupture of quadriceps tendon 01/25/2022   Obstructive sleep apnea syndrome 07/10/2018   Rupture of biceps tendon 06/12/2018   Shoulder strain 06/12/2018    1. OSA (obstructive sleep apnea) (Primary) ***  2. Obesity, morbid (HCC) ***  3. Restrictive lung disease *** - CT Chest High Resolution; Future   General Counseling: I have discussed the findings of the evaluation and examination with Lynwood.  I have also discussed any further diagnostic evaluation thatmay be needed or ordered today. Benyamin verbalizes understanding  of the findings of todays visit. We also reviewed his medications  today and discussed drug interactions and side effects including but not limited excessive drowsiness and altered mental states. We also discussed that there is always a risk not just to him but also people around him. he has been encouraged to call the office with any questions or concerns that should arise related to todays visit.  No orders of the defined types were placed in this encounter.    Time spent: 65  I have personally obtained a history, examined the patient, evaluated laboratory and imaging results, formulated the assessment and plan and placed orders.    Elfreda DELENA Bathe, MD [facility] Pulmonary and Critical Care Sleep medicine

## 2024-12-26 ENCOUNTER — Telehealth: Payer: Self-pay | Admitting: Internal Medicine

## 2024-12-26 NOTE — Telephone Encounter (Signed)
 Lvm notifying patient of CT appointment date, arrival time, location-[place]

## 2024-12-27 ENCOUNTER — Ambulatory Visit: Admitting: Physician Assistant

## 2024-12-27 ENCOUNTER — Encounter: Payer: Self-pay | Admitting: Physician Assistant

## 2024-12-27 VITALS — BP 142/84 | HR 63 | Temp 98.5°F | Ht 72.0 in | Wt 248.0 lb

## 2024-12-27 DIAGNOSIS — I10 Essential (primary) hypertension: Secondary | ICD-10-CM

## 2024-12-27 NOTE — Patient Instructions (Signed)
 Reviewed the Seven S's of hypertension including soil (encouraged plant-forward whole food eating habits), salt (intake <2g), smoking, stimulants (e.g. caffeine), stress, sleep (quantity, quality, timing, snoring/OSA), and sedentary lifestyle (aim for 150 active minutes per week).

## 2024-12-27 NOTE — Assessment & Plan Note (Addendum)
 No change to pharmacotherapy today.  Continue medications as prescribed.  Continue lifestyle improvements with home blood pressure monitoring.  Patient to let me know if readings are consistently above 140 systolic, otherwise I will plan to see him back in about 6 months.  Reviewed the Seven S's of hypertension including soil (encouraged plant-forward whole food eating habits), salt (intake <2g), smoking, stimulants (e.g. caffeine), stress, sleep (quantity, quality, timing, snoring/OSA), and sedentary lifestyle (aim for 150 active minutes per week).

## 2024-12-27 NOTE — Progress Notes (Addendum)
 "   Date:  12/27/2024   Name:  Eugene [name]   DOB:  11-09-1966   MRN:  [id number]   Chief Complaint: Hypertension  Hypertension    [name] returns for 3 month f/u on HTN.  We did not make any dose adjustments at his last visit, but the visit prior we increased carvedilol  to 25 mg BID.  He has tolerated this change fairly well.  He tells me that most of his home blood pressure readings are in the 130s systolic.  Occasionally has outliers in the 140s.  Medication list has been reviewed and updated.  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 07/25/2024   Alopecia 07/05/2023   Gastroesophageal reflux disease 07/05/2023   Gouty arthritis of right foot 07/05/2023   Mixed hyperlipidemia 07/05/2023   Erectile dysfunction 07/05/2023   Thyroid  nodule 07/05/2023   Hypotestosteronism 07/05/2023   Vitamin D  deficiency 07/05/2023   Obesity 07/05/2023   Right thyroid  nodule 07/05/2023   Reversed sleep wake cycle 07/05/2023   Essential hypertension 07/04/2023   Abnormal gait 01/25/2022   Knee pain 01/25/2022   Knee stiff 01/25/2022   Rupture of quadriceps tendon 01/25/2022   Obstructive sleep apnea syndrome 07/10/2018   Rupture of biceps tendon 06/12/2018   Shoulder strain 06/12/2018    Allergies[2]  Immunization History  Administered Date(s) Administered   Influenza, Seasonal, Injecte, Preservative Fre 12/29/2023   Influenza-Unspecified 09/22/2022   Moderna Covid-19 Vaccine Bivalent Booster 25yrs & up 09/11/2021   Moderna Sars-Covid-2 Vaccination 11/21/2020, 04/24/2021   PFIZER(Purple Top)SARS-COV-2 Vaccination 02/27/2020, 03/19/2020   Pneumococcal Polysaccharide-23 10/12/2010, 01/14/2015   Td 10/12/2010, 03/30/2022   Tdap 03/30/2022   Zoster Recombinant(Shingrix) 09/19/2019, 11/27/2019    Past Surgical History:  Procedure Laterality Date   knee sugery Right     KNEE SURGERY Left    5/26 and 6/25   left elbow surgery     LEG SURGERY Left    X2 times   SHOULDER ARTHROSCOPY Right 08/21/2009   rotator cuff repair    Social History[3]  Family History  Problem Relation Age of Onset   Hypertension Father    Alzheimer's disease Father    Sarcoidosis Sister        sarcoidosis lungs   Other Brother        brain damage   Birth defects Brother    Birth defects Son         12/27/2024    2:55 PM 09/26/2024    2:14 PM 07/25/2024    3:12 PM 12/29/2023    1:21 PM  GAD 7 : Generalized Anxiety Score  Nervous, Anxious, on Edge 0 0  0  0   Control/stop worrying 0 0  0  0   Worry too much - different things  0  0  0   Trouble relaxing  0  1  0   Restless  0  0  0   Easily annoyed or irritable  0  0  0   Afraid - awful might happen  0  0  0   Total GAD 7 Score  0 1 0  Anxiety Difficulty  Not difficult at all Not difficult at all Not difficult at all     Data saved with a previous flowsheet row definition       12/27/2024    2:55 PM 09/26/2024    2:14 PM 07/25/2024  3:12 PM  Depression screen PHQ 2/9  Decreased Interest 0 0 1  Down, Depressed, Hopeless 0 0 1  PHQ - 2 Score 0 0 2  Altered sleeping   0  Tired, decreased energy   0  Change in appetite   1  Feeling bad or failure about yourself    0  Trouble concentrating   0  Moving slowly or fidgety/restless   0  Suicidal thoughts   0  PHQ-9 Score   3   Difficult doing work/chores   Not difficult at all     Data saved with a previous flowsheet row definition    BP Readings from Last 3 Encounters:  12/27/24 (!) 142/84  12/25/24 (!) 144/94  11/27/24 (!) 145/85    Wt Readings from Last 3 Encounters:  12/27/24 248 lb (112.5 kg)  12/25/24 247 lb (112 kg)  11/27/24 247 lb (112 kg)    BP (!) 142/84 (Cuff Size: Large)   Pulse 63   Temp 98.5 F (36.9 C) (Oral)   Ht 6' (1.829 m)   Wt 248 lb (112.5 kg)   SpO2 96%   BMI 33.63 kg/m   Physical Exam Vitals and nursing note  reviewed.  Constitutional:      Appearance: Normal appearance.  Cardiovascular:     Rate and Rhythm: Normal rate.  Pulmonary:     Effort: Pulmonary effort is normal.  Abdominal:     General: There is no distension.  Musculoskeletal:        General: Normal range of motion.  Skin:    General: Skin is warm and dry.  Neurological:     Mental Status: He is alert and oriented to person, place, and time.     Gait: Gait is intact.  Psychiatric:        Mood and Affect: Mood and affect normal.     Recent Labs     Component Value Date/Time   NA 135 08/21/2024 1036   K 4.4 08/21/2024 1036   CL 98 08/21/2024 1036   CO2 21 08/21/2024 1036   GLUCOSE 96 08/21/2024 1036   BUN 15 08/21/2024 1036   CREATININE 1.04 08/21/2024 1036   CALCIUM 9.7 08/21/2024 1036   PROT 7.7 08/21/2024 1036   ALBUMIN 4.8 08/21/2024 1036   AST 27 08/21/2024 1036   ALT 29 08/21/2024 1036   ALKPHOS 94 08/21/2024 1036   BILITOT 0.7 08/21/2024 1036    Lab Results  Component Value Date   WBC 5.2 08/21/2024   HGB 15.2 08/21/2024   HCT 45.1 08/21/2024   MCV 94 08/21/2024   PLT 215 08/21/2024   No results found for: HGBA1C Lab Results  Component Value Date   CHOL 190 08/21/2024   HDL 35 (L) 08/21/2024   LDLCALC 116 (H) 08/21/2024   TRIG 222 (H) 08/21/2024   CHOLHDL 5.4 (H) 08/21/2024   Lab Results  Component Value Date   TSH 2.090 08/21/2024        Assessment & Plan Essential hypertension No change to pharmacotherapy today.  Continue medications as prescribed.  Continue lifestyle improvements with home blood pressure monitoring.  Patient to let me know if readings are consistently above 140 systolic, otherwise I will plan to see him back in about 6 months.  Reviewed the Seven S's of hypertension including soil (encouraged plant-forward whole food eating habits), salt (intake <2g), smoking, stimulants (e.g. caffeine), stress, sleep (quantity, quality, timing, snoring/OSA), and sedentary  lifestyle (aim for 150 active minutes per week).  Follow-up as scheduled for August CPE   Dan Kenton Fortin, PA-C, DMSc, DipACLM, Nutritionist [facility] [phone]      [1]  Current Meds  Medication Sig   albuterol  (VENTOLIN  HFA) 108 (90 Base) MCG/ACT inhaler Inhale 1-2 puffs into the lungs every 6 (six) hours as needed for wheezing or shortness of breath.   allopurinol (ZYLOPRIM) 300 MG tablet Take 300 mg by mouth daily.   carvedilol  (COREG ) 25 MG tablet Take 1 tablet (25 mg total) by mouth 2 (two) times daily with a meal.   ezetimibe  (ZETIA ) 10 MG tablet Take 1 tablet (10 mg total) by mouth daily.   finasteride (PROPECIA) 1 MG tablet Take 1 tablet by mouth daily.   fluticasone  (FLONASE ) 50 MCG/ACT nasal spray SPRAY 1 SPRAY INTO BOTH NOSTRILS DAILY.   gemfibrozil  (LOPID ) 600 MG tablet Take 1 tablet (600 mg total) by mouth 2 (two) times daily before a meal.   hydrocortisone 2.5 % cream as needed.   ipratropium (ATROVENT ) 0.06 % nasal spray Place 2 sprays into both nostrils 4 (four) times daily.   levothyroxine  (SYNTHROID ) 75 MCG tablet Take 1 tablet (75 mcg total) by mouth daily before breakfast. Take on empty stomach, first thing in the morning, full glass of water, 30 min before anything else   lisinopril -hydrochlorothiazide  (ZESTORETIC ) 20-25 MG tablet TAKE 1 TABLET BY MOUTH EVERY DAY   metoCLOPramide  (REGLAN ) 5 MG tablet Take 1 tablet (5 mg total) by mouth 3 (three) times daily before meals.   minoxidil (LONITEN) 2.5 MG tablet Take 2.5 mg by mouth daily.   Multiple Vitamin (MULTIVITAMIN) capsule Take 1 capsule by mouth daily.   Omega-3 Fatty Acids (FISH OIL PO) Take by mouth.   testosterone  cypionate (DEPOTESTOSTERONE CYPIONATE) 200 MG/ML injection INJECT 1 ML INTO THE MUSCLE EVERY 14 (FOURTEEN) DAYS. STORE AT ROOM TEMPERATURE IN A DARK PLACE   triamcinolone  cream (KENALOG) 0.1 % SMARTSIG:1  Application Topical 2-3 Times Daily   Vitamin D , Ergocalciferol , (DRISDOL ) 1.25 MG (50000 UNIT) CAPS capsule TAKE 1 CAPSULE BY MOUTH ONE TIME PER WEEK  [2]  Allergies Allergen Reactions   Other Other (See Comments)    Dog Dander. [employer].    Statins     Myalgias and hx multiple tendon ruptures   Atorvastatin Calcium Other (See Comments)    atorvastatin calcium   Cat Dander Other (See Comments)    Other reaction(s): Other (See Comments)  Cat/dog dander - watery eyes  Other reaction(s): Other (See Comments) Cat/dog dander - watery eyes    Cat/dog dander - watery eyes   Cat Hair Extract Other (See Comments)    cat hair extract   Atorvastatin Other (See Comments)    myalgia  atorvastatin  [3]  Social History Tobacco Use   Smoking status: Former    Current packs/day: 0.00    Types: Cigarettes    Quit date: 12/07/1991    Years since quitting: 33.0   Smokeless tobacco: Never  Vaping Use   Vaping status: Never Used  Substance Use Topics   Alcohol use: Yes    Alcohol/week: 7.0 standard drinks of alcohol    Types: 2 Glasses of wine, 5 Cans of beer per week    Comment: beer 1-2 a week   Drug use: No   "

## 2025-01-14 ENCOUNTER — Ambulatory Visit

## 2025-02-05 ENCOUNTER — Ambulatory Visit: Admitting: Internal Medicine

## 2025-07-26 ENCOUNTER — Encounter: Admitting: Physician Assistant
